# Patient Record
Sex: Male | Born: 1965 | Race: White | Hispanic: No | Marital: Married | Smoking: Current every day smoker
Health system: Southern US, Community
[De-identification: ages and names within clinical notes are randomized; demographics above are authoritative.]

## PROBLEM LIST (undated history)

## (undated) ENCOUNTER — Ambulatory Visit: Admission: EM

## (undated) ENCOUNTER — Ambulatory Visit: Admission: RE | Source: Home / Self Care

## (undated) ENCOUNTER — Ambulatory Visit: Admission: EM | Source: Home / Self Care

---

## 2011-02-18 DIAGNOSIS — N529 Male erectile dysfunction, unspecified: Secondary | ICD-10-CM | POA: Insufficient documentation

## 2011-02-18 DIAGNOSIS — F1010 Alcohol abuse, uncomplicated: Secondary | ICD-10-CM | POA: Insufficient documentation

## 2011-02-18 DIAGNOSIS — K219 Gastro-esophageal reflux disease without esophagitis: Secondary | ICD-10-CM | POA: Insufficient documentation

## 2011-02-18 DIAGNOSIS — L821 Other seborrheic keratosis: Secondary | ICD-10-CM | POA: Insufficient documentation

## 2011-02-18 DIAGNOSIS — M542 Cervicalgia: Secondary | ICD-10-CM | POA: Insufficient documentation

## 2012-02-08 DIAGNOSIS — I10 Essential (primary) hypertension: Secondary | ICD-10-CM | POA: Insufficient documentation

## 2012-02-08 DIAGNOSIS — E785 Hyperlipidemia, unspecified: Secondary | ICD-10-CM | POA: Insufficient documentation

## 2012-02-08 DIAGNOSIS — L918 Other hypertrophic disorders of the skin: Secondary | ICD-10-CM | POA: Insufficient documentation

## 2014-05-01 DIAGNOSIS — L409 Psoriasis, unspecified: Secondary | ICD-10-CM | POA: Insufficient documentation

## 2016-06-17 DIAGNOSIS — Z1211 Encounter for screening for malignant neoplasm of colon: Secondary | ICD-10-CM | POA: Insufficient documentation

## 2017-08-07 ENCOUNTER — Ambulatory Visit (INDEPENDENT_AMBULATORY_CARE_PROVIDER_SITE_OTHER): Payer: 59

## 2017-08-07 ENCOUNTER — Ambulatory Visit
Admission: EM | Admit: 2017-08-07 | Discharge: 2017-08-07 | Disposition: A | Discharge status: Home / Self Care | Payer: 59 | Attending: Family Medicine | Admitting: Family Medicine

## 2017-08-07 DIAGNOSIS — S61211A Laceration without foreign body of left index finger without damage to nail, initial encounter: Secondary | ICD-10-CM

## 2017-08-07 DIAGNOSIS — Z23 Encounter for immunization: Secondary | ICD-10-CM | POA: Diagnosis not present

## 2017-08-07 DIAGNOSIS — W298XXA Contact with other powered powered hand tools and household machinery, initial encounter: Secondary | ICD-10-CM

## 2017-08-07 MED ORDER — MUPIROCIN 2 % EX OINT: TOPICAL_OINTMENT | CUTANEOUS | 0 refills | Status: DC

## 2017-08-07 MED ORDER — CEPHALEXIN 500 MG PO CAPS: 500.0000 mg | ORAL_CAPSULE | Freq: Four times a day (QID) | ORAL | 0 refills | Status: AC

## 2017-08-07 MED ORDER — OXYCODONE-ACETAMINOPHEN 5-325 MG PO TABS: 1.0000 | ORAL_TABLET | Freq: Four times a day (QID) | ORAL | 0 refills | Status: AC | PRN

## 2017-08-07 MED ORDER — TETANUS-DIPHTH-ACELL PERTUSSIS 5-2.5-18.5 LF-MCG/0.5 IM SUSP
0.5000 mL | Freq: Once | INTRAMUSCULAR | Status: AC
  Administered 2017-08-07: 0.5 mL via INTRAMUSCULAR

## 2017-08-07 NOTE — ED Provider Notes (Addendum)
[facility] ____________________________________________  Time seen: Approximately 9:12 AM  I have reviewed the triage vital signs and the nursing notes.   HISTORY  Chief Complaint Extremity Laceration  HPI Tyler Pollard is a 52 y.o. male presenting for evaluation of laceration to left index finger that occurred last night around 8 PM.  Patient states that he was at home using a drill, and that the drill bit because the laceration to her left index finger.  States he did clean it last night.  States no other alleviating measures attempted.  States pain to the area as moderate and has continued.  Also reports some loss of sensation to the palmar aspect of the distal index finger and has pain with range of motion.  Denies fevers, purulent drainage, concern of foreign body.  Tetanus immunization greater than 5 years ago.  Denies other aggravating or alleviating factors.  Reports otherwise feels well. Denies recent sickness. Denies recent antibiotic use.    History reviewed. No pertinent past medical history.  Patient Active Problem List   Diagnosis Date Noted  . Colon cancer screening 06/17/2016  . Psoriasis 05/01/2014  . Essential hypertension 02/08/2012  . Hyperlipidemia 02/08/2012  . Skin tag 02/08/2012  . Gastroesophageal reflux disease 02/18/2011  . Male erectile dysfunction 02/18/2011  . Neck pain 02/18/2011  . Nondependent alcohol abuse 02/18/2011  . Seborrheic keratosis 02/18/2011    History reviewed. No pertinent surgical history.   No current facility-administered medications for this encounter.   Current Outpatient Medications:  Marland Kitchen  Multiple Vitamin (MULTI-VITAMINS) TABS, Take by mouth., Disp: , Rfl:  .  omeprazole (PRILOSEC) 40 MG capsule, , Disp: , Rfl:  .  tadalafil (ADCIRCA/CIALIS) 20 MG tablet, TAKE 1 TABLET DAILY AS NEEDED FOR ERECTILE DYSFUNCTION, Disp: , Rfl:  .  triamcinolone cream (KENALOG) 0.1 %, , Disp: , Rfl:  .  cephALEXin (KEFLEX) 500 MG  capsule, Take 1 capsule (500 mg total) by mouth 4 (four) times daily for 7 days., Disp: 28 capsule, Rfl: 0 .  mupirocin ointment (BACTROBAN) 2 %, Apply two times a day for 10 days., Disp: 22 g, Rfl: 0 .  oxyCODONE-acetaminophen (PERCOCET/ROXICET) 5-325 MG tablet, Take 1 tablet by mouth every 6 (six) hours as needed for up to 2 days for moderate pain or severe pain. Do not drive,can cause drowsiness., Disp: 6 tablet, Rfl: 0  Allergies Patient has no known allergies.  No family history on file.  Social History Social History   Tobacco Use  . Smoking status: Current Every Day Smoker    Packs/day: 0.50    Years: 20.00    Pack years: 10.00    Types: Cigarettes  . Smokeless tobacco: Never Used  Substance Use Topics  . Alcohol use: Yes  . Drug use: Not on file    Review of Systems Constitutional: No fever/chills Cardiovascular: Denies chest pain. Respiratory: Denies shortness of breath. Gastrointestinal: No abdominal pain.  Musculoskeletal: Negative for back pain. Skin: As above.   ____________________________________________   PHYSICAL EXAM:  VITAL SIGNS: ED Triage Vitals  Enc Vitals Group     BP 08/07/17 0816 (!) 148/93     Pulse Rate 08/07/17 0816 77     Resp 08/07/17 0816 18     Temp 08/07/17 0816 98.7 F (37.1 C)     Temp Source 08/07/17 0816 Oral     SpO2 08/07/17 0816 97 %     Weight 08/07/17 0818 185 lb (83.9 kg)     Height --  Head Circumference --      Peak Flow --      Pain Score 08/07/17 0818 8     Pain Loc --      Pain Edu? --      Excl. in GC? --     Constitutional: Alert and oriented. Well appearing and in no acute distress. ENT      Head: Normocephalic and atraumatic. Cardiovascular: Normal rate, regular rhythm. Grossly normal heart sounds.  Good peripheral circulation. Respiratory: Normal respiratory effort without tachypnea nor retractions. Breath sounds are clear and equal bilaterally. No wheezes, rales, rhonchi. Musculoskeletal: Steady  gait Neurologic:  Normal speech and language. Speech is normal. No gait instability.  Skin:  Skin is warm, dry except:    Laceration to left palmar index finger as above along the PIP joint to middle phalanx, laceration approximately 3.5 cm jagged and irregular borders, with mild to moderate tenderness to direct palpation.  Immediately distal to laceration and middle phalanx and mid distal phalanx decreased sensation but no loss of sensation, no other sensation changes noted, no point bony tenderness, no foreign body noted, no pain or decreased index extension, pain with index flexion but good DIP joint flexion resistance, slightly diminished PIP flexion no full deficit noted. No visible tendon injury. Left hand otherwise nontender.      Post repair as above.  Psychiatric: Mood and affect are normal. Speech and behavior are normal. Patient exhibits appropriate insight and judgment   ___________________________________________   LABS (all labs ordered are listed, but only abnormal results are displayed)  Labs Reviewed - No data to display  RADIOLOGY  Dg Finger Index Left  Result Date: 08/07/2017 CLINICAL DATA:  Second digit laceration following drill injury, initial encounter EXAM: LEFT INDEX FINGER 2+V COMPARISON:  None. FINDINGS: Mild soft tissue irregularity is noted consistent with the patient's given clinical history. No radiopaque foreign body is noted. No acute bony abnormality is seen. IMPRESSION: Soft tissue defect without bony abnormality. Electronically Signed   By: Alcide CleverMark  Lukens M.D.   On: 08/07/2017 08:41   ____________________________________________   PROCEDURES Procedures  Procedure(s) performed:  Procedure explained and verbal consent obtained. Consent: Verbal consent obtained. Written consent not obtained. Risks and benefits: risks, benefits and alternatives were discussed Patient identity confirmed: verbally with patient and hospital-assigned identification  number  Consent given by: patient   Laceration Repair Location: left index finger Length: 3.5 cm Foreign bodies: no foreign bodies Tendon involvement: none Nerve involvement: none Preparation: Patient was prepped and draped in the usual sterile fashion. Anesthesia with 0.25% bupivacaine digital block as well as local 1% Lidocaine 4 mls Cleaned with Betadine Irrigation solution: saline Irrigation method: jet lavage Amount of cleaning: copious Wound margins revised Repaired with 5-0 nylon Number of sutures: 4 Technique: simple interrupted  Approximation: loose Patient tolerate well. Wound well approximated post repair.  Antibiotic ointment and dressing applied.  Wound care instructions provided.  Observe for any signs of infection or other problems.     INITIAL IMPRESSION / ASSESSMENT AND PLAN / ED COURSE  Pertinent labs & imaging results that were available during my care of the patient were reviewed by me and considered in my medical decision making (see chart for details).  Well-appearing patient.  No acute distress.  Axonal injury last night at home around 8 PM.  Wound with jagged irregular margins, discussed clearly with patient concern for infection.  Patient does have pain with flexion as well as slightly limited PIP joint flexion, unsure if  due to localized swelling or possible tendon injury, no tendon deficit visible within lac.  Like was copiously cleaned, irrigated and repaired as above.  Will start patient on oral Keflex, topical Bactroban and quantity  6 Percocet tablets as needed for pain control.  Recommend close follow-up this week and recommend follow-up with Dr. Stephenie Acres through [facility].  Finger splint applied.  Discussed keeping clean.  Suture removal in 10 days.Discussed indication, risks and benefits of medications with patient.  Tetanus immunization updated.  [place] controlled substance database reviewed and no recent controlled substances  documented.  Discussed follow up with Primary care physician this week. Discussed follow up and return parameters including no resolution or any worsening concerns. Patient verbalized understanding and agreed to plan.   ____________________________________________   FINAL CLINICAL IMPRESSION(S) / ED DIAGNOSES  Final diagnoses:  Laceration of left index finger without foreign body, nail damage status unspecified, initial encounter     ED Discharge Orders        Ordered    oxyCODONE-acetaminophen (PERCOCET/ROXICET) 5-325 MG tablet  Every 6 hours PRN     08/07/17 1001    cephALEXin (KEFLEX) 500 MG capsule  4 times daily     08/07/17 1001    mupirocin ointment (BACTROBAN) 2 %     08/07/17 1001       Note: This dictation was prepared with Dragon dictation along with smaller phrase technology. Any transcriptional errors that result from this process are unintentional.         Renford Dills, NP 08/07/17 1529    Renford Dills, NP 08/07/17 [phone]

## 2017-08-07 NOTE — Discharge Instructions (Addendum)
Take medication as prescribed. Rest. Drink plenty of fluids. Keep clean. Keep in splint.  Sutures removed in 10 days as discussed.  Follow-up with orthopedic hand this week as discussed.  Call tomorrow.  Follow up with your primary care physician this week as needed. Return to Urgent care for new or worsening concerns.

## 2017-08-07 NOTE — ED Triage Notes (Signed)
Pt cut his left index finger on a drill bit yesterday and did get it to stop bleeding but no feeling on both sides of his nail under his finger tip.

## 2017-08-18 ENCOUNTER — Encounter: Payer: Self-pay | Admitting: Emergency Medicine

## 2017-08-18 ENCOUNTER — Other Ambulatory Visit: Payer: Self-pay

## 2017-08-18 ENCOUNTER — Ambulatory Visit
Admission: EM | Admit: 2017-08-18 | Discharge: 2017-08-18 | Disposition: A | Discharge status: Home / Self Care | Payer: 59

## 2017-08-18 NOTE — ED Triage Notes (Signed)
Patient here for suture removal to left index finger. Patient had 4 sutures placed.

## 2018-11-27 ENCOUNTER — Other Ambulatory Visit: Payer: Self-pay

## 2018-11-27 DIAGNOSIS — Z20822 Contact with and (suspected) exposure to covid-19: Secondary | ICD-10-CM

## 2018-11-28 LAB — NOVEL CORONAVIRUS, NAA: SARS-CoV-2, NAA: NOT DETECTED

## 2018-11-28 LAB — INPATIENT

## 2019-07-12 ENCOUNTER — Other Ambulatory Visit: Payer: Self-pay | Admitting: Family Medicine

## 2019-07-17 ENCOUNTER — Other Ambulatory Visit
Admission: RE | Admit: 2019-07-17 | Discharge: 2019-07-17 | Disposition: A | Discharge status: Home / Self Care | Payer: 59 | Attending: Urology | Admitting: Urology

## 2019-07-17 ENCOUNTER — Encounter: Payer: Self-pay | Admitting: Urology

## 2019-07-17 ENCOUNTER — Ambulatory Visit: Payer: 59 | Admitting: Urology

## 2019-07-17 ENCOUNTER — Other Ambulatory Visit: Payer: Self-pay

## 2019-07-17 VITALS — BP 119/88 | HR 102 | Ht 73.0 in | Wt 199.0 lb

## 2019-07-17 DIAGNOSIS — R972 Elevated prostate specific antigen [PSA]: Secondary | ICD-10-CM | POA: Insufficient documentation

## 2019-07-17 MED ORDER — TAMSULOSIN HCL 0.4 MG PO CAPS: 0.4000 mg | ORAL_CAPSULE | Freq: Every day | ORAL | 11 refills | Status: DC

## 2019-07-17 NOTE — Patient Instructions (Signed)
Prostate Cancer Screening  Prostate cancer screening is a test that is done to check for the presence of prostate cancer in men. The prostate gland is a walnut-sized gland that is located below the bladder and in front of the rectum in males. The function of the prostate is to add fluid to semen during ejaculation. Prostate cancer is the second most common type of cancer in men. Who should have prostate cancer screening?  Screening recommendations vary based on age and other risk factors. Screening is recommended if:  You are older than age 55. If you are age 55-69, talk with your health care provider about your need for screening and how often screening should be done. Because most prostate cancers are slow growing and will not cause death, screening is generally reserved in this age group for men who have a 10-15-year life expectancy.  You are younger than age 55, and you have these risk factors: ? Being a black male or a male of African descent. ? Having a father, brother, or uncle who has been diagnosed with prostate cancer. The risk is higher if your family member's cancer occurred at an early age. Screening is not recommended if:  You are younger than age 40.  You are between the ages of 40 and 54 and you have no risk factors.  You are 70 years of age or older. At this age, the risks that screening can cause are greater than the benefits that it may provide. If you are at high risk for prostate cancer, your health care provider may recommend that you have screenings more often or that you start screening at a younger age. How is screening for prostate cancer done? The recommended prostate cancer screening test is a blood test called the prostate-specific antigen (PSA) test. PSA is a protein that is made in the prostate. As you age, your prostate naturally produces more PSA. Abnormally high PSA levels may be caused by:  Prostate cancer.  An enlarged prostate that is not caused by cancer  (benign prostatic hyperplasia, BPH). This condition is very common in older men.  A prostate gland infection (prostatitis). Depending on the PSA results, you may need more tests, such as:  A physical exam to check the size of your prostate gland.  Blood and imaging tests.  A procedure to remove tissue samples from your prostate gland for testing (biopsy). What are the benefits of prostate cancer screening?  Screening can help to identify cancer at an early stage, before symptoms start and when the cancer can be treated more easily.  There is a small chance that screening may lower your risk of dying from prostate cancer. The chance is small because prostate cancer is a slow-growing cancer, and most men with prostate cancer die from a different cause. What are the risks of prostate cancer screening? The main risk of prostate cancer screening is diagnosing and treating prostate cancer that would never have caused any symptoms or problems. This is called overdiagnosisand overtreatment. PSA screening cannot tell you if your PSA is high due to cancer or a different cause. A prostate biopsy is the only procedure to diagnose prostate cancer. Even the results of a biopsy may not tell you if your cancer needs to be treated. Slow-growing prostate cancer may not need any treatment other than monitoring, so diagnosing and treating it may cause unnecessary stress or other side effects. A prostate biopsy may also cause:  Infection or fever.  A false negative. This is   a result that shows that you do not have prostate cancer when you actually do have prostate cancer. Questions to ask your health care provider  When should I start prostate cancer screening?  What is my risk for prostate cancer?  How often do I need screening?  What type of screening tests do I need?  How do I get my test results?  What do my results mean?  Do I need treatment? Where to find more information  The American Cancer  Society: www.cancer.org  American Urological Association: www.auanet.org Contact a health care provider if:  You have difficulty urinating.  You have pain when you urinate or ejaculate.  You have blood in your urine or semen.  You have pain in your back or in the area of your prostate. Summary  Prostate cancer is a common type of cancer in men. The prostate gland is located below the bladder and in front of the rectum. This gland adds fluid to semen during ejaculation.  Prostate cancer screening may identify cancer at an early stage, when the cancer can be treated more easily.  The prostate-specific antigen (PSA) test is the recommended screening test for prostate cancer.  Discuss the risks and benefits of prostate cancer screening with your health care provider. If you are age 70 or older, the risks that screening can cause are greater than the benefits that it may provide. This information is not intended to replace advice given to you by your health care provider. Make sure you discuss any questions you have with your health care provider. Document Revised: 08/10/2018 Document Reviewed: 08/10/2018 Elsevier Patient Education  2020 Elsevier Inc.   Transrectal Ultrasound-Guided Prostate Biopsy A transrectal ultrasound-guided prostate biopsy is a procedure to remove samples of prostate tissue for testing. The procedure uses ultrasound images to guide the process of removing the samples. The samples are taken to a lab to be checked for prostate cancer. This procedure is usually done to evaluate the prostate gland of men who have raised (elevated) levels of prostate-specific antigen (PSA), which can be a sign of prostate cancer. Tell a health care provider about:  Any allergies you have.  All medicines you are taking, including vitamins, herbs, eye drops, creams, and over-the-counter medicines.  Any problems you or family members have had with anesthetic medicines.  Any blood  disorders you have.  Any surgeries you have had.  Any medical conditions you have. What are the risks? Generally, this is a safe procedure. However, problems may occur, including:  Prostate infection.  Bleeding from the rectum.  Blood in the urine.  Allergic reactions to medicines.  Damage to surrounding structures such as blood vessels, organs, or muscles.  Difficulty passing urine.  Nerve damage. This is usually temporary.  Pain. What happens before the procedure? Staying hydrated Follow instructions from your health care provider about hydration, which may include:  Up to 2 hours before the procedure - you may continue to drink clear liquids, such as water, clear fruit juice, black coffee, and plain tea.  Eating and drinking restrictions Follow instructions from your health care provider about eating and drinking, which may include:  8 hours before the procedure - stop eating heavy meals or foods such as meat, fried foods, or fatty foods.  6 hours before the procedure - stop eating light meals or foods, such as toast or cereal.  6 hours before the procedure - stop drinking milk or drinks that contain milk.  2 hours before the procedure -   stop drinking clear liquids. Medicines Ask your health care provider about:  Changing or stopping your regular medicines. This is especially important if you are taking diabetes medicines or blood thinners.  Taking over-the-counter medicines, vitamins, herbs, and supplements.  Taking medicines such as aspirin and ibuprofen. These medicines can thin your blood. Do not take these medicines unless your health care provider tells you to take them. General instructions  You may be given antibiotic medicine to help prevent infection. If so, take the antibiotic as told by your health care provider.  You will be given an enema. During an enema, a liquid is injected into your rectum to clear out waste.  You may have a blood or urine  sample taken.  Plan to have someone take you home from the hospital or clinic. What happens during the procedure?   To lower your risk of infection: ? Your health care team will wash or sanitize their hands. ? Hair may be removed from the surgical area. ? Your skin will be cleaned with a germ-killing soap. ? You may be given antibiotic medicine.  An IV will be inserted into one of your veins.  You will be given one or both of the following: ? A medicine to help you relax (sedative). ? A medicine to numb the area (local anesthetic).  You will be placed on your left side, and your knees will be bent toward your chest.  A probe with lubricated gel will be placed into your rectum, and images will be taken of your prostate and surrounding structures.  Numbing medicine will be injected into your prostate.  A biopsy needle will be inserted through your rectum and guided to your prostate using the ultrasound images.  Prostate tissue samples will be removed, and the needle will then be removed.  The biopsy samples will be sent to a lab to be tested. The procedure may vary among health care providers and hospitals. What happens after the procedure?  Your blood pressure, heart rate, breathing rate, and blood oxygen level will be monitored until the medicines you were given have worn off.  You may have some discomfort in the rectal area. You will be given pain medicine as needed.  Do not drive for 24 hours if you received a sedative. Summary  A transrectal ultrasound-guided biopsy removes samples of tissue from your prostate.  This procedure is usually done to evaluate the prostate gland of men who have raised (elevated) levels of prostate-specific antigen (PSA), which can be a sign of prostate cancer.  After your procedure, you may feel some discomfort in the rectal area.  Plan to have someone take you home from the hospital or clinic. This information is not intended to replace  advice given to you by your health care provider. Make sure you discuss any questions you have with your health care provider. Document Revised: 04/19/2018 Document Reviewed: 03/26/2016 Elsevier Patient Education  2020 Elsevier Inc.  

## 2019-07-17 NOTE — Progress Notes (Signed)
   07/17/19 2:19 PM   Tyler Pollard [birth year] [id number]  CC: Elevated PSA  HPI: I saw Tyler Pollard in urology clinic today for elevated PSA.  He was found to have an elevated PSA of 11.4 on 07/05/2019 on routine screening with his PCP.  Only other prior PSA value was normal at 3.55 in April 2018.  He denies any family history of prostate cancer.  He has a family history of nonlethal breast cancer in his grandmother.  He notes worsening urinary symptoms over the last 6 months with weakening stream and feeling of incomplete emptying.  He denies any gross hematuria or prior urinary infections.  No prior prostate biopsies.  He has moderate erectile dysfunction at baseline and takes Cialis.  This seems to be decreasing in effectiveness lately, and his PCP recently changed him to Viagra but he has not tried this yet.    Social History:  reports that he has been smoking cigarettes. He has a 10.00 pack-year smoking history. He has never used smokeless tobacco. He reports current alcohol use. No history on file for drug use.  Physical Exam: BP 119/88   Pulse (!) 102   Ht 6\' 1"  (1.854 m)   Wt 199 lb (90.3 kg)   BMI 26.25 kg/m    Constitutional:  Alert and oriented, No acute distress. Cardiovascular: No clubbing, cyanosis, or edema. Respiratory: Normal respiratory effort, no increased work of breathing. GI: Abdomen is soft, nontender, nondistended, no abdominal masses DRE: 40g, smooth no nodules or masses  Laboratory Data: See HPI for PSA history  Pertinent Imaging: None to review  Assessment & Plan:   54 year old male with elevated PSA of 11.4 from prior value of 3.5, no family history of prostate cancer, normal DRE.  We reviewed the implications of an elevated PSA and the uncertainty surrounding it. In general, a man's PSA increases with age and is produced by both normal and cancerous prostate tissue. The differential diagnosis for elevated PSA includes BPH, prostate cancer, infection, recent  intercourse/ejaculation, recent urethroscopic manipulation (foley placement/cystoscopy) or trauma, and prostatitis.   Management of an elevated PSA can include observation or prostate biopsy and we discussed this in detail. Our goal is to detect clinically significant prostate cancers, and manage with either active surveillance, surgery, or radiation for localized disease. Risks of prostate biopsy include bleeding, infection (including life threatening sepsis), pain, and lower urinary symptoms. Hematuria, hematospermia, and blood in the stool are all common after biopsy and can persist up to 4 weeks.   PSA today with reflex to free Call with results, if PSA remains elevated pursue prostate biopsy  57, MD 07/17/2019  [facility] [address] [place], [place] [phone]

## 2019-07-18 ENCOUNTER — Other Ambulatory Visit: Payer: Self-pay | Admitting: Urology

## 2019-07-18 LAB — PSA, TOTAL AND FREE
PSA, Free Pct: 9.4 %
PSA, Free: 1.17 ng/mL
Prostate Specific Ag, Serum: 12.5 ng/mL — ABNORMAL HIGH (ref 0.0–4.0)

## 2019-07-18 MED ORDER — DIAZEPAM 5 MG PO TABS: 5.0000 mg | ORAL_TABLET | Freq: Once | ORAL | 0 refills | Status: DC | PRN

## 2019-07-18 NOTE — Progress Notes (Signed)
valim

## 2019-07-19 ENCOUNTER — Telehealth: Payer: Self-pay

## 2019-07-19 NOTE — Telephone Encounter (Signed)
Called pt informed him of the information below. Pt gave verbal understanding. Appt scheduled. No cardiac clearance needed. Pt instructions given and mailed.

## 2019-07-19 NOTE — Telephone Encounter (Signed)
-----   Message from Sondra Come, MD sent at 07/18/2019 10:26 AM EDT ----- PSA remains elevated at 12.5 from 11.4, needs a prostate biopsy as discussed in clinic. Biopsy should be scheduled within the next 2-3 weeks  Please review biopsy instructions, I'll send a valium to his pharmacy, thanks  Legrand Rams, MD 07/18/2019

## 2019-08-02 ENCOUNTER — Other Ambulatory Visit: Payer: 59 | Admitting: Urology

## 2019-08-09 ENCOUNTER — Other Ambulatory Visit: Payer: 59 | Admitting: Urology

## 2019-08-13 ENCOUNTER — Telehealth: Payer: Self-pay

## 2019-08-13 NOTE — Telephone Encounter (Signed)
Called pt he is inquiring about how many days he needs to miss work post prostate biopsy. 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 Advised pt that he will need to take the day off of the procedure but can return to work the next day but must avoid heavy lifting and strenuous activity, note can be provided. Pt gave verbal understanding.

## 2019-08-16 ENCOUNTER — Encounter: Payer: Self-pay | Admitting: Urology

## 2019-08-16 ENCOUNTER — Ambulatory Visit (INDEPENDENT_AMBULATORY_CARE_PROVIDER_SITE_OTHER): Payer: 59 | Admitting: Urology

## 2019-08-16 ENCOUNTER — Other Ambulatory Visit: Payer: Self-pay | Admitting: Urology

## 2019-08-16 ENCOUNTER — Other Ambulatory Visit: Payer: Self-pay

## 2019-08-16 VITALS — BP 120/83 | HR 103 | Ht 73.0 in | Wt 199.0 lb

## 2019-08-16 DIAGNOSIS — R972 Elevated prostate specific antigen [PSA]: Secondary | ICD-10-CM | POA: Diagnosis not present

## 2019-08-16 MED ORDER — GENTAMICIN SULFATE 40 MG/ML IJ SOLN
80.0000 mg | Freq: Once | INTRAMUSCULAR | Status: AC
  Administered 2019-08-16: 80 mg via INTRAMUSCULAR

## 2019-08-16 MED ORDER — LEVOFLOXACIN 500 MG PO TABS
500.0000 mg | ORAL_TABLET | Freq: Once | ORAL | Status: AC
  Administered 2019-08-16: 500 mg via ORAL

## 2019-08-16 NOTE — Progress Notes (Signed)
   08/16/19  Indication: Elevated PSA, 12.5  Prostate Biopsy Procedure   Informed consent was obtained, and we discussed the risks of bleeding and infection/sepsis. A time out was performed to ensure correct patient identity.  Pre-Procedure: - Last PSA Level: 12.5 - Gentamicin and levaquin given for antibiotic prophylaxis - Transrectal Ultrasound performed revealing a 70 gm prostate, PSA density 0.18 - No significant hypoechoic or median lobe noted  Procedure: - Prostate block performed using 10 cc 1% lidocaine and biopsies taken from sextant areas, a total of 12 under ultrasound guidance.  Post-Procedure: - Patient tolerated the procedure well - He was counseled to seek immediate medical attention if experiences significant bleeding, fevers, or severe pain - Return in one week to discuss biopsy results  Assessment/ Plan: Will follow up in 1-2 weeks to discuss pathology  Legrand Rams, MD 08/16/2019

## 2019-08-16 NOTE — Addendum Note (Signed)
Addended by: Frankey Shown on: 08/16/2019 11:55 AM   Modules accepted: Orders

## 2019-08-16 NOTE — Patient Instructions (Signed)

## 2019-08-20 ENCOUNTER — Telehealth: Payer: Self-pay

## 2019-08-20 LAB — SURGICAL PATHOLOGY

## 2019-08-20 NOTE — Telephone Encounter (Signed)
Patient called stating that since his biopsy last week he has had terrible lower back pain and wanted to know if this could be from the procedure. Patient denies any other symptoms, states no fever, chills, blood, or discomfort with urination or bowel movements. It was explained that back pain would not be something expected after the biopsy but perhaps the postion during the procedure may have caused discomfort. Patient was encouraged to follow up with PCP for further evaluation. Patient verbalized understanding

## 2019-08-21 ENCOUNTER — Other Ambulatory Visit: Payer: Self-pay

## 2019-08-21 ENCOUNTER — Ambulatory Visit: Payer: 59 | Admitting: Urology

## 2019-08-21 ENCOUNTER — Encounter: Payer: Self-pay | Admitting: Urology

## 2019-08-21 VITALS — BP 137/89 | HR 79 | Ht 73.0 in | Wt 194.0 lb

## 2019-08-21 DIAGNOSIS — N529 Male erectile dysfunction, unspecified: Secondary | ICD-10-CM

## 2019-08-21 DIAGNOSIS — R972 Elevated prostate specific antigen [PSA]: Secondary | ICD-10-CM

## 2019-08-21 DIAGNOSIS — N138 Other obstructive and reflux uropathy: Secondary | ICD-10-CM

## 2019-08-21 DIAGNOSIS — N401 Enlarged prostate with lower urinary tract symptoms: Secondary | ICD-10-CM | POA: Diagnosis not present

## 2019-08-21 LAB — PATHOLOGY

## 2019-08-21 NOTE — Progress Notes (Signed)
° °  08/21/2019 11:27 AM   Tyler Pollard 05-07-1965 [id number]  Reason for visit: Follow up prostate biopsy results, BPH, ED  HPI: I saw Tyler Pollard back in urology clinic to review his prostate biopsy results.  He is a 54 year old male who had an elevated PSA of 11.4, and on repeat remained elevated at 12.5.  He also has BPH at baseline on Flomax, as well as ED with reduced responsiveness to Cialis and sildenafil max dosing.  He underwent a prostate biopsy on 08/16/2019 that showed a 70 g prostate with a PSA density of 0.18.  All biopsy cores were negative with no evidence of malignancy, ASAP, or HGPIN.  We reviewed these results at length today.  We discussed the approximately 15% false negative rate of a prostate biopsy.  We discussed need for close monitoring of the PSA moving forward with a repeat PSA with reflex to free in 3 months, and consideration of prostate MRI in the future if PSA continues to rise.  Regarding his BPH, his symptoms are currently well controlled on Flomax, but we reviewed the possibility of an outlet procedure in the future with his enlarged 70 g prostate.  Information was provided about HoLEP today.  We also discussed the possible role of follow-up for both outlet procedure and tissue diagnosis if he had a persistently rising PSA with negative prostate biopsy and a negative MRI in the future.  Finally, regarding his erectile dysfunction, we discussed the role of PDE 5 inhibitors and the risks and benefits, as well as the max dosing.  We discussed other options including vacuum erection device or penile injections.  He is unsure if he would like to pursue injections in the future, and will think about it over the next few months.  Continue Flomax for BPH Continue Viagra 100 mg on demand, or Cialis 20 mg on demand for ED RTC 3 months with PSA prior  Sondra Come, MD  [facility] [address] [place], [place] [address] [phone]

## 2019-08-21 NOTE — Patient Instructions (Addendum)
Prostate Cancer Screening  Prostate cancer screening is a test that is done to check for the presence of prostate cancer in men. The prostate gland is a walnut-sized gland that is located below the bladder and in front of the rectum in males. The function of the prostate is to add fluid to semen during ejaculation. Prostate cancer is the second most common type of cancer in men. Who should have prostate cancer screening?  Screening recommendations vary based on age and other risk factors. Screening is recommended if:  You are older than age 21. If you are age 43-69, talk with your health care provider about your need for screening and how often screening should be done. Because most prostate cancers are slow growing and will not cause death, screening is generally reserved in this age group for men who have a 10-15-year life expectancy.  You are younger than age 28, and you have these risk factors: ? Being a black male or a male of African descent. ? Having a father, brother, or uncle who has been diagnosed with prostate cancer. The risk is higher if your family member's cancer occurred at an early age. Screening is not recommended if:  You are younger than age 46.  You are between the ages of 69 and 63 and you have no risk factors.  You are 57 years of age or older. At this age, the risks that screening can cause are greater than the benefits that it may provide. If you are at high risk for prostate cancer, your health care provider may recommend that you have screenings more often or that you start screening at a younger age. How is screening for prostate cancer done? The recommended prostate cancer screening test is a blood test called the prostate-specific antigen (PSA) test. PSA is a protein that is made in the prostate. As you age, your prostate naturally produces more PSA. Abnormally high PSA levels may be caused by:  Prostate cancer.  An enlarged prostate that is not caused by cancer  (benign prostatic hyperplasia, BPH). This condition is very common in older men.  A prostate gland infection (prostatitis). Depending on the PSA results, you may need more tests, such as:  A physical exam to check the size of your prostate gland.  Blood and imaging tests.  A procedure to remove tissue samples from your prostate gland for testing (biopsy). What are the benefits of prostate cancer screening?  Screening can help to identify cancer at an early stage, before symptoms start and when the cancer can be treated more easily.  There is a small chance that screening may lower your risk of dying from prostate cancer. The chance is small because prostate cancer is a slow-growing cancer, and most men with prostate cancer die from a different cause. What are the risks of prostate cancer screening? The main risk of prostate cancer screening is diagnosing and treating prostate cancer that would never have caused any symptoms or problems. This is called overdiagnosisand overtreatment. PSA screening cannot tell you if your PSA is high due to cancer or a different cause. A prostate biopsy is the only procedure to diagnose prostate cancer. Even the results of a biopsy may not tell you if your cancer needs to be treated. Slow-growing prostate cancer may not need any treatment other than monitoring, so diagnosing and treating it may cause unnecessary stress or other side effects. A prostate biopsy may also cause:  Infection or fever.  A false negative. This is  a result that shows that you do not have prostate cancer when you actually do have prostate cancer. Questions to ask your health care provider  When should I start prostate cancer screening?  What is my risk for prostate cancer?  How often do I need screening?  What type of screening tests do I need?  How do I get my test results?  What do my results mean?  Do I need treatment? Where to find more information  The American Cancer  Society: www.cancer.org  American Urological Association: www.auanet.org Contact a health care provider if:  You have difficulty urinating.  You have pain when you urinate or ejaculate.  You have blood in your urine or semen.  You have pain in your back or in the area of your prostate. Summary  Prostate cancer is a common type of cancer in men. The prostate gland is located below the bladder and in front of the rectum. This gland adds fluid to semen during ejaculation.  Prostate cancer screening may identify cancer at an early stage, when the cancer can be treated more easily.  The prostate-specific antigen (PSA) test is the recommended screening test for prostate cancer.  Discuss the risks and benefits of prostate cancer screening with your health care provider. If you are age 70 or older, the risks that screening can cause are greater than the benefits that it may provide. This information is not intended to replace advice given to you by your health care provider. Make sure you discuss any questions you have with your health care provider. Document Revised: 08/10/2018 Document Reviewed: 08/10/2018 Elsevier Patient Education  2020 Elsevier Inc.   Benign Prostatic Hyperplasia  Benign prostatic hyperplasia (BPH) is an enlarged prostate gland that is caused by the normal aging process and not by cancer. The prostate is a walnut-sized gland that is involved in the production of semen. It is located in front of the rectum and below the bladder. The bladder stores urine and the urethra is the tube that carries the urine out of the body. The prostate may get bigger as a man gets older. An enlarged prostate can press on the urethra. This can make it harder to pass urine. The build-up of urine in the bladder can cause infection. Back pressure and infection may progress to bladder damage and kidney (renal) failure. What are the causes? This condition is part of a normal aging process.  However, not all men develop problems from this condition. If the prostate enlarges away from the urethra, urine flow will not be blocked. If it enlarges toward the urethra and compresses it, there will be problems passing urine. What increases the risk? This condition is more likely to develop in men over the age of 50 years. What are the signs or symptoms? Symptoms of this condition include:  Getting up often during the night to urinate.  Needing to urinate frequently during the day.  Difficulty starting urine flow.  Decrease in size and strength of your urine stream.  Leaking (dribbling) after urinating.  Inability to pass urine. This needs immediate treatment.  Inability to completely empty your bladder.  Pain when you pass urine. This is more common if there is also an infection.  Urinary tract infection (UTI). How is this diagnosed? This condition is diagnosed based on your medical history, a physical exam, and your symptoms. Tests will also be done, such as:  A post-void bladder scan. This measures any amount of urine that may remain in your   bladder after you finish urinating.  A digital rectal exam. In a rectal exam, your health care provider checks your prostate by putting a lubricated, gloved finger into your rectum to feel the back of your prostate gland. This exam detects the size of your gland and any abnormal lumps or growths.  An exam of your urine (urinalysis).  A prostate specific antigen (PSA) screening. This is a blood test used to screen for prostate cancer.  An ultrasound. This test uses sound waves to electronically produce a picture of your prostate gland. Your health care provider may refer you to a specialist in kidney and prostate diseases (urologist). How is this treated? Once symptoms begin, your health care provider will monitor your condition (active surveillance or watchful waiting). Treatment for this condition will depend on the severity of your  condition. Treatment may include:  Observation and yearly exams. This may be the only treatment needed if your condition and symptoms are mild.  Medicines to relieve your symptoms, including: ? Medicines to shrink the prostate. ? Medicines to relax the muscle of the prostate.  Surgery in severe cases. Surgery may include: ? Prostatectomy. In this procedure, the prostate tissue is removed completely through an open incision or with a laparoscope or robotics. ? Transurethral resection of the prostate (TURP). In this procedure, a tool is inserted through the opening at the tip of the penis (urethra). It is used to cut away tissue of the inner core of the prostate. The pieces are removed through the same opening of the penis. This removes the blockage. ? Transurethral incision (TUIP). In this procedure, small cuts are made in the prostate. This lessens the prostate's pressure on the urethra. ? Transurethral microwave thermotherapy (TUMT). This procedure uses microwaves to create heat. The heat destroys and removes a small amount of prostate tissue. ? Transurethral needle ablation (TUNA). This procedure uses radio frequencies to destroy and remove a small amount of prostate tissue. ? Interstitial laser coagulation (ILC). This procedure uses a laser to destroy and remove a small amount of prostate tissue. ? Transurethral electrovaporization (TUVP). This procedure uses electrodes to destroy and remove a small amount of prostate tissue. ? Prostatic urethral lift. This procedure inserts an implant to push the lobes of the prostate away from the urethra. Follow these instructions at home:  Take over-the-counter and prescription medicines only as told by your health care provider.  Monitor your symptoms for any changes. Contact your health care provider with any changes.  Avoid drinking large amounts of liquid before going to bed or out in public.  Avoid or reduce how much caffeine or alcohol you  drink.  Give yourself time when you urinate.  Keep all follow-up visits as told by your health care provider. This is important. Contact a health care provider if:  You have unexplained back pain.  Your symptoms do not get better with treatment.  You develop side effects from the medicine you are taking.  Your urine becomes very dark or has a bad smell.  Your lower abdomen becomes distended and you have trouble passing your urine. Get help right away if:  You have a fever or chills.  You suddenly cannot urinate.  You feel lightheaded, or very dizzy, or you faint.  There are large amounts of blood or clots in the urine.  Your urinary problems become hard to manage.  You develop moderate to severe low back or flank pain. The flank is the side of your body between the ribs   and the hip. These symptoms may represent a serious problem that is an emergency. Do not wait to see if the symptoms will go away. Get medical help right away. Call your local emergency services (911 in the [place] Do not drive yourself to the hospital. Summary  Benign prostatic hyperplasia (BPH) is an enlarged prostate that is caused by the normal aging process and not by cancer.  An enlarged prostate can press on the urethra. This can make it hard to pass urine.  This condition is part of a normal aging process and is more likely to develop in men over the age of 50 years.  Get help right away if you suddenly cannot urinate. This information is not intended to replace advice given to you by your health care provider. Make sure you discuss any questions you have with your health care provider. Document Revised: 11/22/2017 Document Reviewed: 02/02/2016 Elsevier Patient Education  2020 Elsevier Inc.  Holmium Laser Enucleation of the Prostate (HoLEP)  HoLEP is a treatment for men with benign prostatic hyperplasia (BPH). The laser surgery removed blockages of urine flow, and is done without any incisions on  the body.     What is HoLEP?  HoLEP is a type of laser surgery used to treat obstruction (blockage) of urine flow as a result of benign prostatic hyperplasia (BPH). In men with BPH, the prostate gland is not cancerous, but has become enlarged. An enlarged prostate can result in a number of urinary tract symptoms such as weak urinary stream, difficulty in starting urination, inability to urinate, frequent urination, or getting up at night to urinate.  HoLEP was developed in the 1990's as a more effective and less expensive surgical option for BPH, compared to other surgical options such as laser vaporization(PVP/greenlight laser), transurethral resection of the prostate(TURP), and open simple prostatectomy.   What happens during a HoLEP?  HoLEP requires general anesthesia (asleep throughout the procedure).   An antibiotic is given to reduce the risk of infection  A surgical instrument called a resectoscope is inserted through the urethra (the tube that carries urine from the bladder). The resectoscope has a camera that allows the surgeon to view the internal structure of the prostate gland, and to see where the incisions are being made during surgery.  The laser is inserted into the resectoscope and is used to enucleate (free up) the enlarged prostate tissue from the capsule (outer shell) and then to seal up any blood vessels. The tissue that has been removed is pushed back into the bladder.  A morcellator is placed through the resectoscope, and is used to suction out the prostate tissue that has been pushed into the bladder.  When the prostate tissue has been removed, the resectoscope is removed, and a foley catheter is placed to allow healing and drain the urine from the bladder.     What happens after a HoLEP?  More than 90% of patients go home the same day a few hours after surgery. Less than 10% will be admitted to the hospital overnight for observation to monitor the urine, or if  they have other medical problems.  Fluid is flushed through the catheter for about 1 hour after surgery to clear any blood from the urine. It is normal to have some blood in the urine after surgery. The need for blood transfusion is extremely rare.  Eating and drinking are permitted after the procedure once the patient has fully awakened from anesthesia.  The catheter is usually removed 2-3  days after surgery- the patient will come to clinic to have the catheter removed and make sure they can urinate on their own.  It is very important to drink lots of fluids after surgery for one week to keep the bladder flushed.  At first, there may be some burning with urination, but this typically improved within a few hours to days. Most patients do not have a significant amount of pain, and narcotic pain medications are rarely needed.  Symptoms of urinary frequency, urgency, and even leakage are NORMAL for the first few weeks after surgery as the bladder adjusts after having to work hard against blockage from the prostate for many years. This will improve, but can sometimes take several months.  The use of pelvic floor exercises (Kegel exercises) can help improve problems with urinary incontinence.   After catheter removal, patients will be seen at 6 weeks and 6 months for symptom check  No heavy lifting for at least 2-3 weeks after surgery, however patients can walk and do light activities the first day after surgery. Return to work time depends on occupation.    What are the advantages of HoLEP?  HoLEP has been studied in many different parts of the world and has been shown to be a safe and effective procedure. Although there are many types of BPH surgeries available, HoLEP offers a unique advantage in being able to remove a large amount of tissue without any incisions on the body, even in very large prostates, while decreasing the risk of bleeding and providing tissue for pathology (to look for  cancer). This decreases the need for blood transfusions during surgery, minimizes hospital stay, and reduces the risk of needing repeat treatment.  What are the side effects of HoLEP?  Temporary burning and bleeding during urination. Some blood may be seen in the urine for weeks after surgery and is part of the healing process.  Urinary incontinence (inability to control urine flow) is expected in all patients immediately after surgery and they should wear pads for the first few days/weeks. This typically improves over the course of several weeks. Performing Kegel exercises can help decrease leakage from stress maneuvers such as coughing, sneezing, or lifting. The rate of long term leakage is very low. Patients may also have leakage with urgency and this may be treated with medication. The risk of urge incontinence can be dependent on several factors including age, prostate size, symptoms, and other medical problems.  Retrograde ejaculation or "backwards ejaculation." In 75% of cases, the patient will not see any fluid during ejaculation after surgery.  Erectile function is generally not significantly affected.   What are the risks of HoLEP?  Injury to the urethra or development of scar tissue at a later date  Injury to the capsule of the prostate (typically treated with longer catheterization).  Injury to the bladder or ureteral orifices (where the urine from the kidney drains out)  Infection of the bladder, testes, or kidneys  Return of urinary obstruction at a later date requiring another operation (<2%)  Need for blood transfusion or re-operation due to bleeding  Failure to relieve all symptoms and/or need for prolonged catheterization after surgery  5-15% of patients are found to have previously undiagnosed prostate cancer in their specimen. Prostate cancer can be treated after HoLEP.  Standard risks of anesthesia including blood clots, heart attacks, etc  When should I call my  doctor?  Fever over 101.3 degrees  Inability to urinate, or large blood clots in the   urine

## 2019-08-23 ENCOUNTER — Ambulatory Visit: Payer: 59 | Admitting: Urology

## 2019-08-27 IMAGING — CR DG FINGER INDEX 2+V*L*
3 series · 3 of 3 positions shown · non-contrast
Comparison: None.

CLINICAL DATA: Second digit laceration following drill injury,
initial encounter

EXAM:
LEFT INDEX FINGER 2+V

[finger ap]
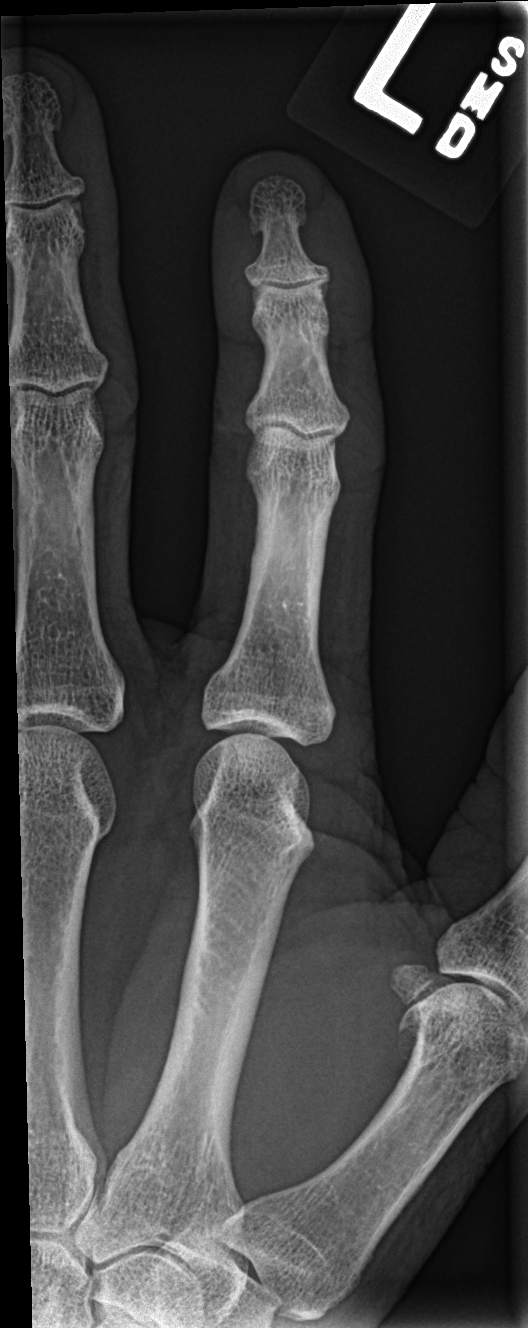

[finger obl]
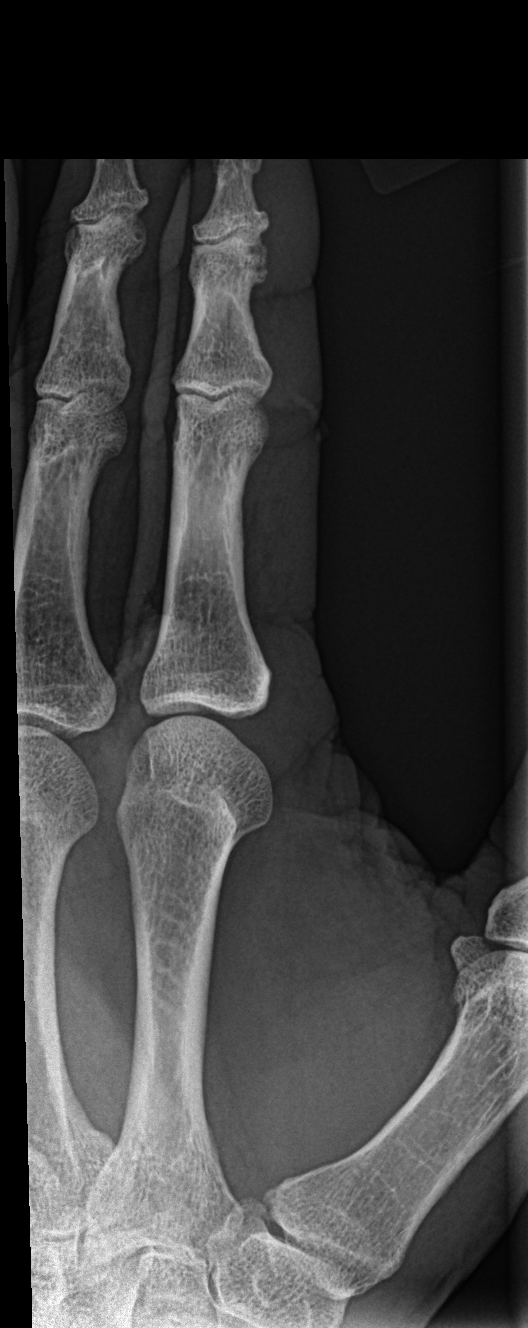

[finger lat]
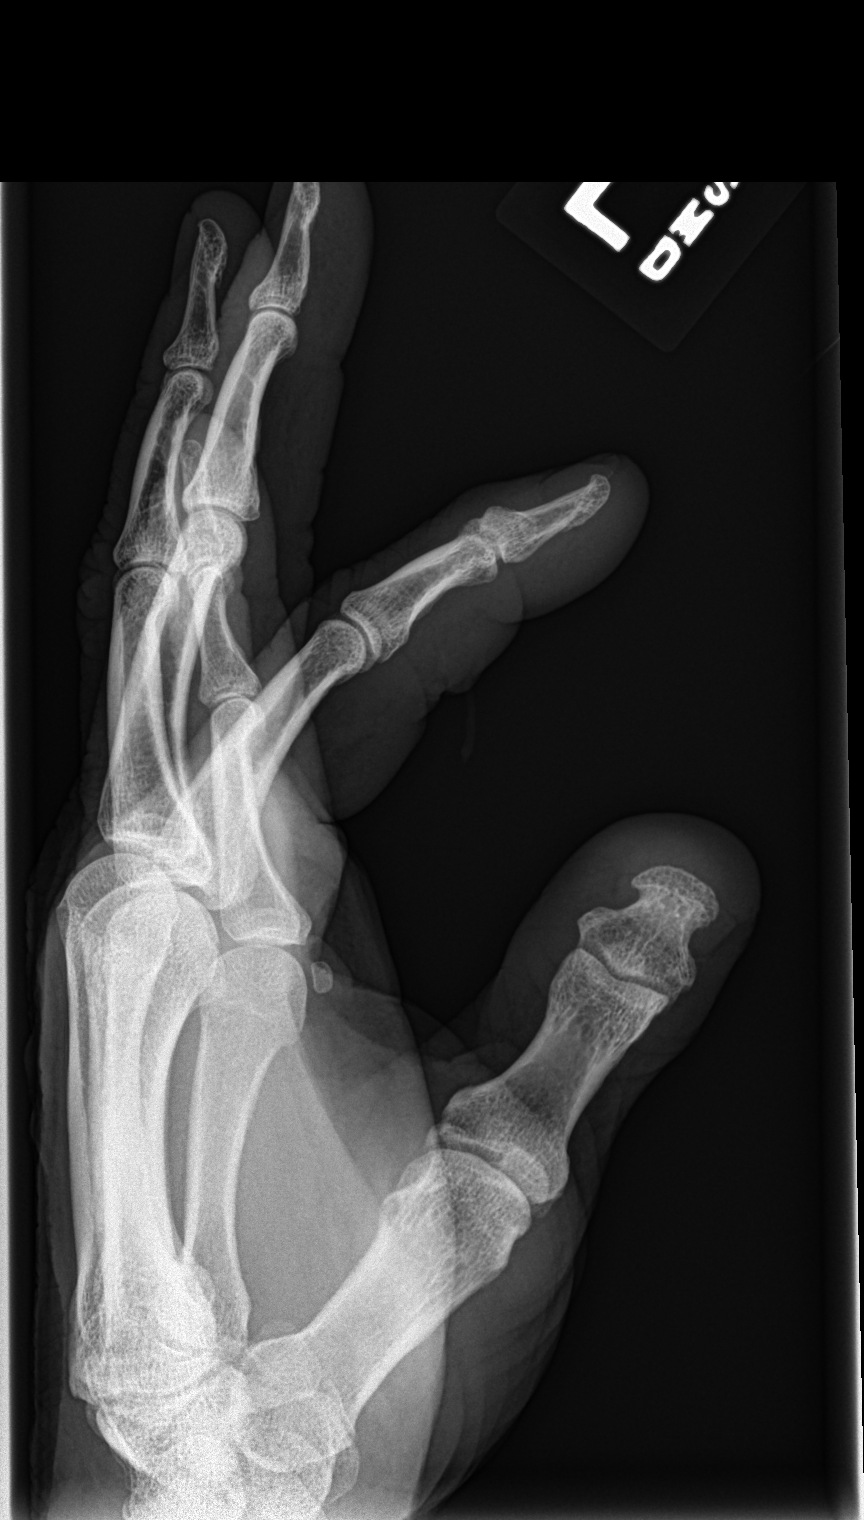

[3 of 3 positions shown; findings below may reference images not displayed]

FINDINGS: Mild soft tissue irregularity is noted consistent with the patient's
given clinical history. No radiopaque foreign body is noted. No
acute bony abnormality is seen.
IMPRESSION: Soft tissue defect without bony abnormality.

## 2019-11-19 ENCOUNTER — Other Ambulatory Visit: Payer: Self-pay

## 2019-11-19 DIAGNOSIS — R972 Elevated prostate specific antigen [PSA]: Secondary | ICD-10-CM

## 2019-11-20 ENCOUNTER — Ambulatory Visit: Payer: 59 | Admitting: Urology

## 2020-07-30 ENCOUNTER — Telehealth: Payer: 59 | Admitting: Urology

## 2020-07-30 ENCOUNTER — Other Ambulatory Visit: Payer: Self-pay | Admitting: Urology

## 2020-07-30 DIAGNOSIS — N138 Other obstructive and reflux uropathy: Secondary | ICD-10-CM

## 2020-07-30 DIAGNOSIS — N401 Enlarged prostate with lower urinary tract symptoms: Secondary | ICD-10-CM

## 2020-07-30 MED ORDER — TAMSULOSIN HCL 0.4 MG PO CAPS: 0.4000 mg | ORAL_CAPSULE | Freq: Every day | ORAL | 0 refills | Status: DC

## 2020-07-30 NOTE — Telephone Encounter (Signed)
Short term RX was filled for patient.

## 2020-07-30 NOTE — Telephone Encounter (Signed)
Patient called and asked if we could call in his Flomax for 30 days until his new insurance kicks in? He was told that he needed an appt first before we would refill it.  He said he would make an appt after he gets his insurance.   Tyler Pollard

## 2020-09-09 ENCOUNTER — Other Ambulatory Visit
Admission: RE | Admit: 2020-09-09 | Discharge: 2020-09-09 | Disposition: A | Discharge status: Home / Self Care | Payer: Self-pay | Attending: Urology | Admitting: Urology

## 2020-09-09 DIAGNOSIS — R972 Elevated prostate specific antigen [PSA]: Secondary | ICD-10-CM | POA: Insufficient documentation

## 2020-09-10 LAB — FPSA% REFLEX
% FREE PSA: 10.9 %
PSA, FREE: 0.87 ng/mL

## 2020-09-10 LAB — PSA (REFLEX TO FREE) (SERIAL): Prostate Specific Ag, Serum: 8 ng/mL — ABNORMAL HIGH (ref 0.0–4.0)

## 2020-09-16 ENCOUNTER — Ambulatory Visit (INDEPENDENT_AMBULATORY_CARE_PROVIDER_SITE_OTHER): Payer: Self-pay | Admitting: Urology

## 2020-09-16 ENCOUNTER — Other Ambulatory Visit: Payer: Self-pay

## 2020-09-16 ENCOUNTER — Encounter: Payer: Self-pay | Admitting: Urology

## 2020-09-16 VITALS — BP 121/83 | HR 76 | Ht 73.0 in | Wt 195.0 lb

## 2020-09-16 DIAGNOSIS — N529 Male erectile dysfunction, unspecified: Secondary | ICD-10-CM

## 2020-09-16 DIAGNOSIS — Z125 Encounter for screening for malignant neoplasm of prostate: Secondary | ICD-10-CM

## 2020-09-16 DIAGNOSIS — R972 Elevated prostate specific antigen [PSA]: Secondary | ICD-10-CM

## 2020-09-16 DIAGNOSIS — N401 Enlarged prostate with lower urinary tract symptoms: Secondary | ICD-10-CM

## 2020-09-16 DIAGNOSIS — N138 Other obstructive and reflux uropathy: Secondary | ICD-10-CM

## 2020-09-16 LAB — BLADDER SCAN AMB NON-IMAGING

## 2020-09-16 MED ORDER — TAMSULOSIN HCL 0.4 MG PO CAPS: 0.4000 mg | ORAL_CAPSULE | Freq: Every day | ORAL | 11 refills | Status: DC

## 2020-09-16 MED ORDER — TADALAFIL 20 MG PO TABS: 20.0000 mg | ORAL_TABLET | Freq: Every day | ORAL | 11 refills | Status: DC | PRN

## 2020-09-16 MED ORDER — DIAZEPAM 5 MG PO TABS: 5.0000 mg | ORAL_TABLET | Freq: Once | ORAL | 0 refills | Status: DC | PRN

## 2020-09-16 NOTE — Patient Instructions (Signed)
Prostatic Urethral Lift Prostatic urethral lift is a surgical procedure to relieve symptoms of prostate gland enlargement that occurs with age (benign prostatic hypertrophy, BPH). The part of the body that drains urine from the bladder (urethra) passes between the two lobes of the prostate. As the prostate enlarges, it can push on the urethra and cause problems with urinating. This procedure involves placing an implant that holds the prostate away from the urethra. The procedure is performed with a thin operating telescope (cystoscope) that is inserted through the tip of the penis and moved up the urethra to the prostate. This is less invasive than other procedures that require an incision. You may have this procedure if: You have symptoms of BPH. Your prostate is not severely enlarged. Medicines to treat BPH are not working or not tolerated. You want to avoid possible sexual side effects from medicines or other procedures that are used to treat BPH. Tell a health care provider about: Any allergies you have. All medicines you are taking, including vitamins, herbs, eye drops, creams, and over-the-counter medicines. Previous problems you or members of your family have had with the use of anesthetics. Any blood disorders you have. Previous surgeries you have had. Any medical conditions you have. What are the risks? Bleeding. Infection. Leaking of urine (incontinence). Allergic reactions to medicines. Return of BPH symptoms after 2 years, requiring more treatment. What happens before the procedure? Ask your health care provider about: Changing or stopping your regular medicines. This is especially important if you are taking diabetes medicines or blood thinners. Taking medicines such as aspirin and ibuprofen. These medicines can thin your blood. Do not take these medicines before your procedure if your health care provider instructs you not to. Follow instructions from your health care provider  about eating or drinking restrictions. Plan to have someone take you home from the hospital or clinic. What happens during the procedure? To lower your risk of infection: Your health care team will wash or sanitize their hands. Your skin will be washed with soap. You may be given an antibiotic medicine. An IV may be inserted into one of your veins. You will be given one or more of the following: A medicine to help you relax (sedative). A medicine that is injected into your urethra to numb the area (local anesthetic). A medicine to make you fall asleep (general anesthetic). A cystoscope will be inserted into your penis and moved through your urethra to your prostate. A device will be inserted through the cystoscope and used to press the lobes of your prostate away from your urethra. Implants will be inserted through the device to hold the lobes of your prostate in the widened position. The device and cystoscope will be removed. The procedure may vary among health care providers and hospitals. What happens after the procedure? Your blood pressure, heart rate, breathing rate, and blood oxygen level will be monitored until the medicines you were given have worn off. Do not drive for 24 hours if you were given a sedative. Summary Prostatic urethral lift is a surgical procedure to relieve symptoms of prostate gland enlargement that occurs with age (benign prostatic hypertrophy, BPH). The procedure is performed with a thin operating telescope (cystoscope) that is inserted through the tip of the penis and moved up the part of the body that drains urine from the bladder (urethra) to reach the prostate. This is less invasive than other procedures that require an incision. Plan to have someone take you home from the hospital  or clinic. You will not be allowed to drive for 24 hours if you were given a sedative during the procedure. This information is not intended to replace advice given to you by your  health care provider. Make sure you discuss any questions you have with your health care provider. Document Revised: 04/09/2020 Document Reviewed: 09/06/2019 Elsevier Patient Education  2022 [employer]  ----------------------------------------------------------------------------------------------------------------------------  Holmium Laser Enucleation of the Prostate (HoLEP)  HoLEP is a treatment for men with benign prostatic hyperplasia (BPH). The laser surgery removed blockages of urine flow, and is done without any incisions on the body.     What is HoLEP?  HoLEP is a type of laser surgery used to treat obstruction (blockage) of urine flow as a result of benign prostatic hyperplasia (BPH). In men with BPH, the prostate gland is not cancerous, but has become enlarged. An enlarged prostate can result in a number of urinary tract symptoms such as weak urinary stream, difficulty in starting urination, inability to urinate, frequent urination, or getting up at night to urinate.  HoLEP was developed in the 1990's as a more effective and less expensive surgical option for BPH, compared to other surgical options such as laser vaporization(PVP/greenlight laser), transurethral resection of the prostate(TURP), and open simple prostatectomy.   What happens during a HoLEP?  HoLEP requires general anesthesia ("asleep" throughout the procedure).   An antibiotic is given to reduce the risk of infection  A surgical instrument called a resectoscope is inserted through the urethra (the tube that carries urine from the bladder). The resectoscope has a camera that allows the surgeon to view the internal structure of the prostate gland, and to see where the incisions are being made during surgery.  The laser is inserted into the resectoscope and is used to enucleate (free up) the enlarged prostate tissue from the capsule (outer shell) and then to seal up any blood vessels. The tissue that has been removed  is pushed back into the bladder.  A morcellator is placed through the resectoscope, and is used to suction out the prostate tissue that has been pushed into the bladder.  When the prostate tissue has been removed, the resectoscope is removed, and a foley catheter is placed to allow healing and drain the urine from the bladder.     What happens after a HoLEP?  More than 90% of patients go home the same day a few hours after surgery. Less than 10% will be admitted to the hospital overnight for observation to monitor the urine, or if they have other medical problems.  Fluid is flushed through the catheter for about 1 hour after surgery to clear any blood from the urine. It is normal to have some blood in the urine after surgery. The need for blood transfusion is extremely rare.  Eating and drinking are permitted after the procedure once the patient has fully awakened from anesthesia.  The catheter is usually removed 2-3 days after surgery- the patient will come to clinic to have the catheter removed and make sure they can urinate on their own.  It is very important to drink lots of fluids after surgery for one week to keep the bladder flushed.  At first, there may be some burning with urination, but this typically improved within a few hours to days. Most patients do not have a significant amount of pain, and narcotic pain medications are rarely needed.  Symptoms of urinary frequency, urgency, and even leakage are NORMAL for the first few weeks  after surgery as the bladder adjusts after having to work hard against blockage from the prostate for many years. This will improve, but can sometimes take several months.  The use of pelvic floor exercises (Kegel exercises) can help improve problems with urinary incontinence.   After catheter removal, patients will be seen at 6 weeks and 6 months for symptom check  No heavy lifting for at least 2-3 weeks after surgery, however patients can walk and  do light activities the first day after surgery. Return to work time depends on occupation.    What are the advantages of HoLEP?  HoLEP has been studied in many different parts of the world and has been shown to be a safe and effective procedure. Although there are many types of BPH surgeries available, HoLEP offers a unique advantage in being able to remove a large amount of tissue without any incisions on the body, even in very large prostates, while decreasing the risk of bleeding and providing tissue for pathology (to look for cancer). This decreases the need for blood transfusions during surgery, minimizes hospital stay, and reduces the risk of needing repeat treatment.  What are the side effects of HoLEP?  Temporary burning and bleeding during urination. Some blood may be seen in the urine for weeks after surgery and is part of the healing process.  Urinary incontinence (inability to control urine flow) is expected in all patients immediately after surgery and they should wear pads for the first few days/weeks. This typically improves over the course of several weeks. Performing Kegel exercises can help decrease leakage from stress maneuvers such as coughing, sneezing, or lifting. The rate of long term leakage is very low. Patients may also have leakage with urgency and this may be treated with medication. The risk of urge incontinence can be dependent on several factors including age, prostate size, symptoms, and other medical problems.  Retrograde ejaculation or "backwards ejaculation." In 75% of cases, the patient will not see any fluid during ejaculation after surgery.  Erectile function is generally not significantly affected.   What are the risks of HoLEP?  Injury to the urethra or development of scar tissue at a later date  Injury to the capsule of the prostate (typically treated with longer catheterization).  Injury to the bladder or ureteral orifices (where the urine from the  kidney drains out)  Infection of the bladder, testes, or kidneys  Return of urinary obstruction at a later date requiring another operation (<2%)  Need for blood transfusion or re-operation due to bleeding  Failure to relieve all symptoms and/or need for prolonged catheterization after surgery  5-15% of patients are found to have previously undiagnosed prostate cancer in their specimen. Prostate cancer can be treated after HoLEP.  Standard risks of anesthesia including blood clots, heart attacks, etc  When should I call my doctor?  Fever over 101.3 degrees  Inability to urinate, or large blood clots in the urine

## 2020-09-16 NOTE — Progress Notes (Signed)
   09/16/2020 2:56 PM   Keawe Marcello 1965-02-10 [id number]  Reason for visit: Follow up elevated PSA, BPH, ED  HPI: 55 year old male who underwent a negative prostate biopsy in August 2021 for an elevated PSA of 12.5, which showed a 70 g prostate.  He also has BPH on Flomax with primary complaint of weak stream and feeling of incomplete emptying. If he stops the Flomax he notices significant worsening of his urinary symptoms. I recommended close follow-up in 3 months for repeat PSA and consideration of prostate MRI, but he did not follow-up.  He has ED that is well controlled currently on Cialis 20 mg daily.    He reports no major changes over the last year aside from some worsening of his urinary symptoms with weaker stream.  PVR is mildly elevated at 220 mL in clinic today.  PSA is 8(10.9% free) which is down from 12.5 last year.  We reviewed the risks and benefits and controversies surrounding PSA screening.  He is primarily interested in discussing outlet procedures today, as he continues to be bothered by his urination.  I recommended cystoscopy for better evaluation of prostate anatomy prior to undergoing any surgery, but he would likely be a good candidate for UroLift or HOLEP, and we reviewed the risks and benefits of these procedures at length.  -Flomax and Cialis refilled -Follow-up for cystoscopy for better evaluation of UroLift versus HOLEP for BPH  Sondra Come, MD  [facility] [address], [address] [place], [place] [address] [phone]

## 2020-11-27 ENCOUNTER — Other Ambulatory Visit: Payer: Self-pay | Admitting: Urology

## 2020-11-27 ENCOUNTER — Encounter: Payer: Self-pay | Admitting: Urology

## 2021-09-15 ENCOUNTER — Ambulatory Visit (INDEPENDENT_AMBULATORY_CARE_PROVIDER_SITE_OTHER): Payer: BC Managed Care – PPO | Admitting: Urology

## 2021-09-15 ENCOUNTER — Encounter: Payer: Self-pay | Admitting: Urology

## 2021-09-15 VITALS — BP 154/90 | HR 78 | Ht 73.0 in | Wt 204.8 lb

## 2021-09-15 DIAGNOSIS — N138 Other obstructive and reflux uropathy: Secondary | ICD-10-CM | POA: Diagnosis not present

## 2021-09-15 DIAGNOSIS — N401 Enlarged prostate with lower urinary tract symptoms: Secondary | ICD-10-CM

## 2021-09-15 DIAGNOSIS — N529 Male erectile dysfunction, unspecified: Secondary | ICD-10-CM

## 2021-09-15 DIAGNOSIS — R972 Elevated prostate specific antigen [PSA]: Secondary | ICD-10-CM

## 2021-09-15 MED ORDER — TAMSULOSIN HCL 0.4 MG PO CAPS: 0.4000 mg | ORAL_CAPSULE | Freq: Every day | ORAL | 11 refills | Status: DC

## 2021-09-15 MED ORDER — TADALAFIL 20 MG PO TABS: 20.0000 mg | ORAL_TABLET | Freq: Every day | ORAL | 11 refills | Status: DC | PRN

## 2021-09-15 NOTE — Patient Instructions (Signed)
Holmium Laser Enucleation of the Prostate (HoLEP)  HoLEP is a treatment for men with benign prostatic hyperplasia (BPH). The laser surgery removed blockages of urine flow, and is done without any incisions on the body.     What is HoLEP?  HoLEP is a type of laser surgery used to treat obstruction (blockage) of urine flow as a result of benign prostatic hyperplasia (BPH). In men with BPH, the prostate gland is not cancerous, but has become enlarged. An enlarged prostate can result in a number of urinary tract symptoms such as weak urinary stream, difficulty in starting urination, inability to urinate, frequent urination, or getting up at night to urinate.  HoLEP was developed in the 1990's as a more effective and less expensive surgical option for BPH, compared to other surgical options such as laser vaporization(PVP/greenlight laser), transurethral resection of the prostate(TURP), and open simple prostatectomy.   What happens during a HoLEP?  HoLEP requires general anesthesia ("asleep" throughout the procedure).   An antibiotic is given to reduce the risk of infection  A surgical instrument called a resectoscope is inserted through the urethra (the tube that carries urine from the bladder). The resectoscope has a camera that allows the surgeon to view the internal structure of the prostate gland, and to see where the incisions are being made during surgery.  The laser is inserted into the resectoscope and is used to enucleate (free up) the enlarged prostate tissue from the capsule (outer shell) and then to seal up any blood vessels. The tissue that has been removed is pushed back into the bladder.  A morcellator is placed through the resectoscope, and is used to suction out the prostate tissue that has been pushed into the bladder.  When the prostate tissue has been removed, the resectoscope is removed, and a foley catheter is placed to allow healing and drain the urine from the  bladder.     What happens after a HoLEP?  More than 90% of patients go home the same day a few hours after surgery. Less than 10% will be admitted to the hospital overnight for observation to monitor the urine, or if they have other medical problems.  Fluid is flushed through the catheter for about 1 hour after surgery to clear any blood from the urine. It is normal to have some blood in the urine after surgery. The need for blood transfusion is extremely rare.  Eating and drinking are permitted after the procedure once the patient has fully awakened from anesthesia.  The catheter is usually removed 2-3 days after surgery- the patient will come to clinic to have the catheter removed and make sure they can urinate on their own.  It is very important to drink lots of fluids after surgery for one week to keep the bladder flushed.  At first, there may be some burning with urination, but this typically improved within a few hours to days. Most patients do not have a significant amount of pain, and narcotic pain medications are rarely needed.  Symptoms of urinary frequency, urgency, and even leakage are NORMAL for the first few weeks after surgery as the bladder adjusts after having to work hard against blockage from the prostate for many years. This will improve, but can sometimes take several months.  The use of pelvic floor exercises (Kegel exercises) can help improve problems with urinary incontinence.   After catheter removal, patients will be seen at 6 weeks and 6 months for symptom check  No heavy lifting for   at least 2-3 weeks after surgery, however patients can walk and do light activities the first day after surgery. Return to work time depends on occupation.    What are the advantages of HoLEP?  HoLEP has been studied in many different parts of the world and has been shown to be a safe and effective procedure. Although there are many types of BPH surgeries available, HoLEP offers a  unique advantage in being able to remove a large amount of tissue without any incisions on the body, even in very large prostates, while decreasing the risk of bleeding and providing tissue for pathology (to look for cancer). This decreases the need for blood transfusions during surgery, minimizes hospital stay, and reduces the risk of needing repeat treatment.  What are the side effects of HoLEP?  Temporary burning and bleeding during urination. Some blood may be seen in the urine for weeks after surgery and is part of the healing process.  Urinary incontinence (inability to control urine flow) is expected in all patients immediately after surgery and they should wear pads for the first few days/weeks. This typically improves over the course of several weeks. Performing Kegel exercises can help decrease leakage from stress maneuvers such as coughing, sneezing, or lifting. The rate of long term leakage is very low. Patients may also have leakage with urgency and this may be treated with medication. The risk of urge incontinence can be dependent on several factors including age, prostate size, symptoms, and other medical problems.  Retrograde ejaculation or "backwards ejaculation." In 75% of cases, the patient will not see any fluid during ejaculation after surgery.  Erectile function is generally not significantly affected.   What are the risks of HoLEP?  Injury to the urethra or development of scar tissue at a later date  Injury to the capsule of the prostate (typically treated with longer catheterization).  Injury to the bladder or ureteral orifices (where the urine from the kidney drains out)  Infection of the bladder, testes, or kidneys  Return of urinary obstruction at a later date requiring another operation (<2%)  Need for blood transfusion or re-operation due to bleeding  Failure to relieve all symptoms and/or need for prolonged catheterization after surgery  5-15% of patients are  found to have previously undiagnosed prostate cancer in their specimen. Prostate cancer can be treated after HoLEP.  Standard risks of anesthesia including blood clots, heart attacks, etc  When should I call my doctor?  Fever over 101.3 degrees  Inability to urinate, or large blood clots in the urine   Prostatic Urethral Lift  Prostatic urethral lift is a surgical procedure to treat symptoms of prostate gland enlargement that occurs with age (benign prostatic hypertrophy, BPH). The urethra passes between the two lobes of the prostate. The urethra is the part of the body that drains urine from the bladder. As the prostate enlarges, it can push on the urethra and cause problems with urinating. This procedure involves placing an implant that holds the prostate away from the urethra. The procedure is done using a thin device called a cystoscope. The device is inserted through the tip of the penis and moved up the urethra to the prostate. This is less invasive than other procedures that require an incision. You may have this procedure if: You have symptoms of BPH. Your prostate is not severely enlarged. Medicines to treat BPH are not working or not tolerated. You want to avoid possible sexual side effects from medicines or other procedures that   are used to treat BPH. Tell a health care provider about: Any allergies you have. All medicines you are taking, including vitamins, herbs, eye drops, creams, and over-the-counter medicines. Any problems you or family members have had with anesthetic medicines. Any bleeding problems you have. Any surgeries you have had. Any medical conditions you have. What are the risks? Generally, this is a safe procedure. However, problems may occur, including: Bleeding. Infection. Leaking of urine (incontinence). Allergic reactions to medicines. Return of BPH symptoms after 2 years, requiring more treatment. What happens before the procedure? When to stop  eating and drinking Follow instructions from your health care provider about what you may eat and drink before your procedure. These may include: 8 hours before your procedure Stop eating most foods. Do not eat meat, fried foods, or fatty foods. Eat only light foods, such as toast or crackers. All liquids are okay except energy drinks and alcohol. 6 hours before your procedure Stop eating. Drink only clear liquids, such as water, clear fruit juice, black coffee, plain tea, and sports drinks. Do not drink energy drinks or alcohol. 2 hours before your procedure Stop drinking all liquids. You may be allowed to take medicines with small sips of water. If you do not follow your health care provider's instructions, your procedure may be delayed or canceled. Medicines Ask your health care provider about: Changing or stopping your regular medicines. This is especially important if you are taking diabetes medicines or blood thinners. Taking medicines such as aspirin and ibuprofen. These medicines can thin your blood. Do not take these medicines unless your health care provider tells you to take them. Taking over-the-counter medicines, vitamins, herbs, and supplements. Surgery safety Ask your health care provider what steps will be taken to help prevent infection. These steps may include: Removing hair at the surgery site. Washing skin with a germ-killing soap. Taking antibiotic medicine. General instructions Do not use any products that contain nicotine or tobacco for at least 4 weeks before the procedure. These products include cigarettes, chewing tobacco, and vaping devices, such as e-cigarettes. If you need help quitting, ask your health care provider. If you will be going home right after the procedure, plan to have a responsible adult: Take you home from the hospital or clinic. You will not be allowed to drive. Care for you for the time you are told. What happens during the procedure? An IV  may be inserted into one of your veins. You will be given one or more of the following: A medicine to help you relax (sedative). A medicine that is injected into your urethra to numb the area (local anesthetic). A medicine to make you fall asleep (general anesthetic). A cystoscope will be inserted into your penis and moved through your urethra to your prostate. A device will be inserted through the cystoscope and used to press the lobes of your prostate away from your urethra. Implants will be inserted through the device to hold the lobes of your prostate in the widened position. The device and cystoscope will be removed. The procedure may vary among health care providers and hospitals. What happens after the procedure? Your blood pressure, heart rate, breathing rate, and blood oxygen level be monitored until you leave the hospital or clinic. If you were given a sedative during the procedure, it can affect you for several hours. Do not drive or operate machinery until your health care provider says that it is safe. Summary Prostatic urethral lift is a surgical procedure to relieve   symptoms of prostate gland enlargement that occurs with age (benign prostatic hypertrophy, BPH). The procedure is performed with a thin device called a cystoscope. This device is inserted through the tip of the penis and moved up the urethra to reach the prostate. This is less invasive than other procedures that require an incision. If you will be going home right after the procedure, plan to have a responsible adult take you home from the hospital or clinic. You will not be allowed to drive. This information is not intended to replace advice given to you by your health care provider. Make sure you discuss any questions you have with your health care provider. Document Revised: 07/25/2020 Document Reviewed: 07/25/2020 Elsevier Patient Education  2023 [employer]  

## 2021-09-15 NOTE — Progress Notes (Signed)
   09/15/2021 2:34 PM   Tyler Pollard 1965/04/04 [id number]  Reason for visit: Follow up elevated PSA, BPH, ED  HPI: 56 year old male who underwent a negative prostate biopsy in August 2021 for an elevated PSA of 12.5, which showed a 70 g prostate.  He also has BPH on Flomax with primary complaint of weak stream and feeling of incomplete emptying. If he stops the Flomax he notices significant worsening of his urinary symptoms. I previously recommended considering prostate MRI and repeat PSA, but he deferred.  He reports no major changes over the last year.  On the Flomax he does well with the urination and really denies significant complaint, but if he stops the Flomax his stream is much weaker.  He denies any UTIs or gross hematuria.  I previously had recommended cystoscopy for consideration of an outlet procedure like UroLift or HOLEP, but he no showed.  We again reviewed cystoscopy and the importance of prostate anatomy prior to deciding on a outlet procedure, as well as the risks and benefits of UroLift versus HOLEP discussed extensively.  We discussed the risks and benefits of HoLEP at length.  The procedure requires general anesthesia and takes 1 to 2 hours, and a holmium laser is used to enucleate the prostate and push this tissue into the bladder.  A morcellator is then used to remove this tissue, which is sent for pathology.  The vast majority(>95%) of patients are able to discharge the same day with a catheter in place for 2 to 3 days, and will follow-up in clinic for a voiding trial.  We specifically discussed the risks of bleeding, infection, retrograde ejaculation, temporary urgency and urge incontinence, very low risk of long-term incontinence, urethral stricture/bladder neck contracture, pathologic evaluation of prostate tissue and possible detection of prostate cancer or other malignancy, and possible need for additional procedures.  He continues on Cialis 20 mg as needed for ED with good  results  -Flomax and Cialis refilled -Return precautions discussed at length -RTC 1 year PSA reflex to free prior, PVR  Sondra Come, MD  [facility] [address], [address] [place], [place] [address] [phone]

## 2022-09-21 ENCOUNTER — Encounter: Payer: Self-pay | Admitting: Urology

## 2022-09-21 ENCOUNTER — Ambulatory Visit (INDEPENDENT_AMBULATORY_CARE_PROVIDER_SITE_OTHER): Payer: Self-pay | Admitting: Urology

## 2022-09-21 VITALS — BP 158/99 | HR 90 | Ht 73.0 in | Wt 202.8 lb

## 2022-09-21 DIAGNOSIS — R972 Elevated prostate specific antigen [PSA]: Secondary | ICD-10-CM

## 2022-09-21 DIAGNOSIS — N529 Male erectile dysfunction, unspecified: Secondary | ICD-10-CM

## 2022-09-21 DIAGNOSIS — N138 Other obstructive and reflux uropathy: Secondary | ICD-10-CM

## 2022-09-21 DIAGNOSIS — N401 Enlarged prostate with lower urinary tract symptoms: Secondary | ICD-10-CM

## 2022-09-21 MED ORDER — TAMSULOSIN HCL 0.4 MG PO CAPS: 0.4000 mg | ORAL_CAPSULE | Freq: Every day | ORAL | 3 refills | Status: DC

## 2022-09-21 MED ORDER — TADALAFIL 20 MG PO TABS: 20.0000 mg | ORAL_TABLET | Freq: Every day | ORAL | 11 refills | Status: DC | PRN

## 2022-09-21 NOTE — Patient Instructions (Addendum)
If Cialis or Flomax are expensive at your local pharmacy, would recommend [email or web address], if you would like to change pharmacies just send [place] a MyChart message and we can send your prescriptions over   Holmium Laser Enucleation of the Prostate (HoLEP)  HoLEP is a treatment for men with benign prostatic hyperplasia (BPH). The laser surgery removed blockages of urine flow, and is done without any incisions on the body.     What is HoLEP?  HoLEP is a type of laser surgery used to treat obstruction (blockage) of urine flow as a result of benign prostatic hyperplasia (BPH). In men with BPH, the prostate gland is not cancerous, but has become enlarged. An enlarged prostate can result in a number of urinary tract symptoms such as weak urinary stream, difficulty in starting urination, inability to urinate, frequent urination, or getting up at night to urinate.  HoLEP was developed in the 1990's as a more effective and less expensive surgical option for BPH, compared to other surgical options such as laser vaporization(PVP/greenlight laser), transurethral resection of the prostate(TURP), and open simple prostatectomy.   What happens during a HoLEP?  HoLEP requires general anesthesia ("asleep" throughout the procedure).   An antibiotic is given to reduce the risk of infection  A surgical instrument called a resectoscope is inserted through the urethra (the tube that carries urine from the bladder). The resectoscope has a camera that allows the surgeon to view the internal structure of the prostate gland, and to see where the incisions are being made during surgery.  The laser is inserted into the resectoscope and is used to enucleate (free up) the enlarged prostate tissue from the capsule (outer shell) and then to seal up any blood vessels. The tissue that has been removed is pushed back into the bladder.  A morcellator is placed through the resectoscope, and is used to suction out the prostate  tissue that has been pushed into the bladder.  When the prostate tissue has been removed, the resectoscope is removed, and a foley catheter is placed to allow healing and drain the urine from the bladder.     What happens after a HoLEP?  More than 90% of patients go home the same day a few hours after surgery. Less than 10% will be admitted to the hospital overnight for observation to monitor the urine, or if they have other medical problems.  Fluid is flushed through the catheter for about 1 hour after surgery to clear any blood from the urine. It is normal to have some blood in the urine after surgery. The need for blood transfusion is extremely rare.  Eating and drinking are permitted after the procedure once the patient has fully awakened from anesthesia.  The catheter is usually removed 2-3 days after surgery- the patient will come to clinic to have the catheter removed and make sure they can urinate on their own.  It is very important to drink lots of fluids after surgery for one week to keep the bladder flushed.  At first, there may be some burning with urination, but this typically improved within a few hours to days. Most patients do not have a significant amount of pain, and narcotic pain medications are rarely needed.  Symptoms of urinary frequency, urgency, and even leakage are NORMAL for the first few weeks after surgery as the bladder adjusts after having to work hard against blockage from the prostate for many years. This will improve, but can sometimes take several months.  The use of  pelvic floor exercises (Kegel exercises) can help improve problems with urinary incontinence.   After catheter removal, patients will be seen at 6 weeks and 6 months for symptom check  No heavy lifting for at least 2-3 weeks after surgery, however patients can walk and do light activities the first day after surgery. Return to work time depends on occupation.    What are the advantages of  HoLEP?  HoLEP has been studied in many different parts of the world and has been shown to be a safe and effective procedure. Although there are many types of BPH surgeries available, HoLEP offers a unique advantage in being able to remove a large amount of tissue without any incisions on the body, even in very large prostates, while decreasing the risk of bleeding and providing tissue for pathology (to look for cancer). This decreases the need for blood transfusions during surgery, minimizes hospital stay, and reduces the risk of needing repeat treatment.  What are the side effects of HoLEP?  Temporary burning and bleeding during urination. Some blood may be seen in the urine for weeks after surgery and is part of the healing process.  Urinary incontinence (inability to control urine flow) is expected in all patients immediately after surgery and they should wear pads for the first few days/weeks. This typically improves over the course of several weeks. Performing Kegel exercises can help decrease leakage from stress maneuvers such as coughing, sneezing, or lifting. The rate of long term leakage is very low. Patients may also have leakage with urgency and this may be treated with medication. The risk of urge incontinence can be dependent on several factors including age, prostate size, symptoms, and other medical problems.  Retrograde ejaculation or "backwards ejaculation." In 75% of cases, the patient will not see any fluid during ejaculation after surgery.  Erectile function is generally not significantly affected.   What are the risks of HoLEP?  Injury to the urethra or development of scar tissue at a later date  Injury to the capsule of the prostate (typically treated with longer catheterization).  Injury to the bladder or ureteral orifices (where the urine from the kidney drains out)  Infection of the bladder, testes, or kidneys  Return of urinary obstruction at a later date requiring  another operation (<2%)  Need for blood transfusion or re-operation due to bleeding  Failure to relieve all symptoms and/or need for prolonged catheterization after surgery  5-15% of patients are found to have previously undiagnosed prostate cancer in their specimen. Prostate cancer can be treated after HoLEP.  Standard risks of anesthesia including blood clots, heart attacks, etc  When should I call my doctor?  Fever over 101.3 degrees  Inability to urinate, or large blood clots in the urine

## 2022-09-21 NOTE — Progress Notes (Signed)
   09/21/2022 4:09 PM   Tyler Pollard 1965-01-30 [id number]  Reason for visit: Follow up elevated PSA, BPH and urinary symptoms, ED  HPI: 57 year old male who originally presented with an elevated PSA of 12.5 and underwent a prostate biopsy in August 2021 that showed only benign tissue and a 70 g prostate.  He also has obstructive urinary symptoms on Flomax with severe weak stream and worsening symptoms when he comes off Flomax.  We have previously discussed consideration of outlet procedures but he has deferred multiple times.  He denies any major changes in the urination and continues on Flomax.  He also has ED that is well-controlled with Cialis 20 mg on demand.  Most recent PSA from August 2022 8.0 which decreased from 12.5 at time of negative biopsy.  Will recheck PSA in the next week, counseled to avoid ejaculations 2 to 3 days prior to PSA, call with results.  We again reviewed outlet procedures like HOLEP, but he would like to continue Flomax at this time.  Return precautions discussed at length.  Cialis and Flomax refilled Will call with PSA results RTC yearly for PVR, PSA screening  Sondra Come, MD  [facility] [address], [address] [place], [place] [address] [phone]

## 2023-08-08 ENCOUNTER — Other Ambulatory Visit: Payer: Self-pay | Admitting: Urology

## 2023-08-08 DIAGNOSIS — N138 Other obstructive and reflux uropathy: Secondary | ICD-10-CM

## 2023-09-20 ENCOUNTER — Ambulatory Visit: Payer: Self-pay | Admitting: Urology

## 2023-09-20 ENCOUNTER — Other Ambulatory Visit
Admission: RE | Admit: 2023-09-20 | Discharge: 2023-09-20 | Disposition: A | Discharge status: Home / Self Care | Attending: Urology | Admitting: Urology

## 2023-09-20 VITALS — BP 138/105 | HR 94 | Wt 200.0 lb

## 2023-09-20 DIAGNOSIS — N138 Other obstructive and reflux uropathy: Secondary | ICD-10-CM | POA: Diagnosis not present

## 2023-09-20 DIAGNOSIS — R972 Elevated prostate specific antigen [PSA]: Secondary | ICD-10-CM

## 2023-09-20 DIAGNOSIS — N529 Male erectile dysfunction, unspecified: Secondary | ICD-10-CM

## 2023-09-20 DIAGNOSIS — N401 Enlarged prostate with lower urinary tract symptoms: Secondary | ICD-10-CM | POA: Insufficient documentation

## 2023-09-20 LAB — BLADDER SCAN AMB NON-IMAGING

## 2023-09-20 MED ORDER — TAMSULOSIN HCL 0.4 MG PO CAPS: 0.4000 mg | ORAL_CAPSULE | Freq: Every day | ORAL | 3 refills | Status: AC

## 2023-09-20 MED ORDER — TAMSULOSIN HCL 0.4 MG PO CAPS: 0.4000 mg | ORAL_CAPSULE | Freq: Every day | ORAL | 3 refills | Status: DC

## 2023-09-20 MED ORDER — TADALAFIL 10 MG PO TABS: 10.0000 mg | ORAL_TABLET | Freq: Every day | ORAL | 6 refills | Status: DC

## 2023-09-20 NOTE — Addendum Note (Signed)
 Addended by: ELOUISE SANTA BROCKS on: 09/20/2023 01:50 PM   Modules accepted: Orders

## 2023-09-20 NOTE — Progress Notes (Signed)
   09/20/2023 1:14 PM   Tyler Pollard [birth year] [id number]  Reason for visit: Follow up elevated PSA, BPH  History: Elevated PSA 12.5 in August 2021 and biopsy showed benign tissue.  Prostate Repeat PSA decreased 8, he did not follow-up for PSA the last 2 years Flomax  for obstructive urinary symptoms, severe worsening if misses dose, deferred outlet procedures multiple times previously, PVRs have been normal ED moderately controlled with Cialis  20 mg on demand  Physical Exam: BP (!) 138/105 (BP Location: Left Arm, Patient Position: Sitting, Cuff Size: Normal)   Pulse 94   Wt 200 lb (90.7 kg)   SpO2 96%   BMI 26.39 kg/m   Imaging/labs: PSA today pending  Today: Urinary symptoms may be slightly worse with weak stream, still not interested in outlet procedures, PVR normal at 99ml.  Denies incontinence or dysuria Moderate results on Cialis  PRN  Plan:   We reviewed the AUA guidelines regarding the risks and benefits of PSA screening, I recommended a repeat PSA today with reflex to free, if significantly elevated greater than 15 would recommend considering a prostate MRI. In terms of his ED, recommended trialing Cialis  10 mg daily with 10 mg boost dose as needed, this may also improve his urinary symptoms If no improvement in urination with the daily Cialis , can try 0.8 mg dose Flomax  or 0.4 twice daily Will contact with PSA results All medications sent to [email or web address]   Tyler JAYSON Burnet, MD  [facility] [address] [place], [place] [address] [phone]

## 2023-09-20 NOTE — Patient Instructions (Signed)
 Your medication has been sent to [facility]. This is an Therapist, occupational that offers medication at a significantly discounted price. You will need to go to the website ([email or web address]) to sign up for an account, give your address and enter your payment method.

## 2023-09-21 ENCOUNTER — Ambulatory Visit: Payer: Self-pay | Admitting: Urology

## 2023-09-21 LAB — PSA, TOTAL AND FREE
PSA, Free Pct: 20 %
PSA, Free: 0.8 ng/mL
Prostate Specific Ag, Serum: 4 ng/mL (ref 0.0–4.0)

## 2023-09-23 ENCOUNTER — Encounter: Payer: Self-pay | Admitting: Emergency Medicine

## 2023-09-23 ENCOUNTER — Ambulatory Visit
Admission: EM | Admit: 2023-09-23 | Discharge: 2023-09-23 | Disposition: A | Discharge status: Home / Self Care | Source: Home / Self Care

## 2023-09-23 DIAGNOSIS — M79671 Pain in right foot: Secondary | ICD-10-CM | POA: Insufficient documentation

## 2023-09-23 DIAGNOSIS — G8929 Other chronic pain: Secondary | ICD-10-CM | POA: Insufficient documentation

## 2023-09-23 DIAGNOSIS — M79672 Pain in left foot: Secondary | ICD-10-CM | POA: Insufficient documentation

## 2023-09-23 DIAGNOSIS — M79652 Pain in left thigh: Secondary | ICD-10-CM | POA: Insufficient documentation

## 2023-09-23 DIAGNOSIS — R21 Rash and other nonspecific skin eruption: Secondary | ICD-10-CM | POA: Diagnosis not present

## 2023-09-23 DIAGNOSIS — M545 Low back pain, unspecified: Secondary | ICD-10-CM | POA: Insufficient documentation

## 2023-09-23 DIAGNOSIS — M79651 Pain in right thigh: Secondary | ICD-10-CM | POA: Insufficient documentation

## 2023-09-23 DIAGNOSIS — A419 Sepsis, unspecified organism: Secondary | ICD-10-CM | POA: Diagnosis not present

## 2023-09-23 LAB — CBC WITH DIFFERENTIAL/PLATELET
Abs Immature Granulocytes: 0.04 K/uL (ref 0.00–0.07)
Basophils Absolute: 0.1 K/uL (ref 0.0–0.1)
Basophils Relative: 1 %
Eosinophils Absolute: 0.2 K/uL (ref 0.0–0.5)
Eosinophils Relative: 2 %
HCT: 46 % (ref 39.0–52.0)
Hemoglobin: 16.5 g/dL (ref 13.0–17.0)
Immature Granulocytes: 1 %
Lymphocytes Relative: 8 %
Lymphs Abs: 0.6 K/uL — ABNORMAL LOW (ref 0.7–4.0)
MCH: 32.2 pg (ref 26.0–34.0)
MCHC: 35.9 g/dL (ref 30.0–36.0)
MCV: 89.8 fL (ref 80.0–100.0)
Monocytes Absolute: 0.8 K/uL (ref 0.1–1.0)
Monocytes Relative: 10 %
Neutro Abs: 6.5 K/uL (ref 1.7–7.7)
Neutrophils Relative %: 78 %
Platelets: 180 K/uL (ref 150–400)
RBC: 5.12 MIL/uL (ref 4.22–5.81)
RDW: 13.2 % (ref 11.5–15.5)
WBC: 8.2 K/uL (ref 4.0–10.5)
nRBC: 0 % (ref 0.0–0.2)

## 2023-09-23 LAB — COMPREHENSIVE METABOLIC PANEL WITH GFR
ALT: 42 U/L (ref 0–44)
AST: 31 U/L (ref 15–41)
Albumin: 4.9 g/dL (ref 3.5–5.0)
Alkaline Phosphatase: 65 U/L (ref 38–126)
Anion gap: 9 (ref 5–15)
BUN: 9 mg/dL (ref 6–20)
CO2: 24 mmol/L (ref 22–32)
Calcium: 8.9 mg/dL (ref 8.9–10.3)
Chloride: 99 mmol/L (ref 98–111)
Creatinine, Ser: 0.92 mg/dL (ref 0.61–1.24)
GFR, Estimated: >60 mL/min (ref >60)
Glucose, Bld: 103 mg/dL — ABNORMAL HIGH (ref 70–99)
Potassium: 3.9 mmol/L (ref 3.5–5.1)
Sodium: 132 mmol/L — ABNORMAL LOW (ref 135–145)
Total Bilirubin: 1.2 mg/dL (ref 0.0–1.2)
Total Protein: 8.4 g/dL — ABNORMAL HIGH (ref 6.5–8.1)

## 2023-09-23 LAB — VITAMIN B12: Vitamin B-12: 456 pg/mL (ref 180–914)

## 2023-09-23 LAB — C-REACTIVE PROTEIN: CRP: 13.6 mg/dL — ABNORMAL HIGH (ref <1.0)

## 2023-09-23 LAB — TSH: TSH: 1.836 u[IU]/mL (ref 0.350–4.500)

## 2023-09-23 LAB — CK: Total CK: 43 U/L — ABNORMAL LOW (ref 49–397)

## 2023-09-23 MED ORDER — PREDNISONE 10 MG PO TABS: ORAL_TABLET | ORAL | 0 refills | Status: DC

## 2023-09-23 MED ORDER — HYDROCODONE-ACETAMINOPHEN 5-325 MG PO TABS: 1.0000 | ORAL_TABLET | Freq: Four times a day (QID) | ORAL | 0 refills | Status: DC | PRN

## 2023-09-23 MED ORDER — KETOROLAC TROMETHAMINE 60 MG/2ML IM SOLN
30.0000 mg | Freq: Once | INTRAMUSCULAR | Status: AC
  Administered 2023-09-23: 30 mg via INTRAMUSCULAR

## 2023-09-23 NOTE — ED Provider Notes (Signed)
 [facility]    CSN: [id number] Arrival date & time: 09/23/23  1415      History   Chief Complaint Chief Complaint  Patient presents with   Leg Pain    bilateral    HPI Tyler Pollard is a 58 y.o. male with history of hypertension, hyperlipidemia, psoriasis, erectile dysfunction, GERD, tobacco abuse and alcohol abuse presents for bilateral thigh and bilateral foot pain x 3 days. No injuries or falls.  Patient says he went to see his urologist and had a ultrasound of his bladder performed as well as labs drawn to check PSA.  He says later that evening or the following day is when his pain began.  He reports taking a lot of Tylenol  for the pain and says it eased off the discomfort barely.  He says he currently has no pain in his back, hips, knees, calves, chest, abdomen.  He does report intermittent lumbar spinal pain over the past year. Increased pain with forward flexion. Denies any associated swelling, rashes, sores or lesions.  No numbness, weakness or tingling.  Patient says he has never experienced this type of discomfort before.  He has no history of peripheral artery disease, DVT, vitamin deficiency, electrolyte imbalances, restless leg syndrome, and does not take any statins.  Denies getting overheated before onset of symptoms.  No other complaints or concerns.  HPI  History reviewed. No pertinent past medical history.  Patient Active Problem List   Diagnosis Date Noted   Elevated PSA 07/17/2019   Colon cancer screening 06/17/2016   Psoriasis 05/01/2014   Essential hypertension 02/08/2012   Hyperlipidemia 02/08/2012   Skin tag 02/08/2012   Gastroesophageal reflux disease 02/18/2011   Erectile dysfunction 02/18/2011   Neck pain 02/18/2011   Nondependent alcohol abuse 02/18/2011   Seborrheic keratosis 02/18/2011    History reviewed. No pertinent surgical history.     Home Medications    Prior to Admission medications   Medication Sig Start Date End Date  Taking? Authorizing Provider  HYDROcodone -acetaminophen  (NORCO/VICODIN) 5-325 MG tablet Take 1 tablet by mouth every 6 (six) hours as needed for up to 3 days for severe pain (pain score 7-10). 09/23/23 09/26/23 Yes Arvis Jolan NOVAK, PA-C  omeprazole (PRILOSEC) 40 MG capsule  07/21/17  Yes [provider]  predniSONE  (DELTASONE ) 10 MG tablet Take 6 tabs po on day 1 and decrease by 1 tab daily until complete 09/23/23  Yes Arvis Jolan B, PA-C  tamsulosin  (FLOMAX ) 0.4 MG CAPS capsule Take 1 capsule (0.4 mg total) by mouth daily after supper. [email or web address] 09/20/23  Yes Francisca Redell BROCKS, MD  tadalafil  (CIALIS ) 10 MG tablet Take 1 tablet (10 mg total) by mouth daily. OK to take additional 10mg  1 hour prior to sexual activity 09/20/23   Francisca Redell BROCKS, MD    Family History History reviewed. No pertinent family history.  Social History Social History   Tobacco Use   Smoking status: Every Day    Current packs/day: 0.50    Average packs/day: 0.5 packs/day for 20.0 years (10.0 ttl pk-yrs)    Types: Cigarettes    Passive exposure: Current   Smokeless tobacco: Never  Vaping Use   Vaping status: Never Used  Substance Use Topics   Alcohol use: Yes   Drug use: Never     Allergies   Patient has no known allergies.   Review of Systems Review of Systems  Constitutional:  Negative for fatigue and fever.  Respiratory:  Negative for shortness of breath.  Cardiovascular:  Negative for chest pain.  Gastrointestinal:  Negative for abdominal pain.  Musculoskeletal:  Positive for arthralgias, back pain and myalgias. Negative for gait problem and joint swelling.  Skin:  Negative for color change, pallor, rash and wound.  Neurological:  Negative for dizziness, weakness and numbness.     Physical Exam Triage Vital Signs ED Triage Vitals  Encounter Vitals Group     BP 09/23/23 1430 (!) 140/91     Girls Systolic BP Percentile --      Girls Diastolic BP  Percentile --      Boys Systolic BP Percentile --      Boys Diastolic BP Percentile --      Pulse Rate 09/23/23 1430 97     Resp 09/23/23 1430 15     Temp 09/23/23 1430 98.6 F (37 C)     Temp Source 09/23/23 1430 Oral     SpO2 09/23/23 1430 95 %     Weight 09/23/23 1428 199 lb 15.3 oz (90.7 kg)     Height 09/23/23 1428 6' 1 (1.854 m)     Head Circumference --      Peak Flow --      Pain Score 09/23/23 1428 8     Pain Loc --      Pain Education --      Exclude from Growth Chart --    No data found.  Updated Vital Signs BP (!) 140/91 (BP Location: Left Arm)   Pulse 97   Temp 98.6 F (37 C) (Oral)   Resp 15   Ht 6' 1 (1.854 m)   Wt 199 lb 15.3 oz (90.7 kg)   SpO2 95%   BMI 26.38 kg/m      Physical Exam Vitals and nursing note reviewed.  Constitutional:      General: He is not in acute distress.    Appearance: Normal appearance. He is well-developed. He is not ill-appearing.  HENT:     Head: Normocephalic and atraumatic.  Eyes:     Conjunctiva/sclera: Conjunctivae normal.  Cardiovascular:     Rate and Rhythm: Normal rate and regular rhythm.     Pulses: Normal pulses.     Comments: Full symmetric pulses of lower extremities. No pallor of skin or hair loss on legs. Warm extremities. No wounds/ulcers.  Pulmonary:     Effort: Pulmonary effort is normal. No respiratory distress.     Breath sounds: Normal breath sounds.  Musculoskeletal:     Cervical back: Neck supple.     Comments: No tenderness of spine, low back, bilateral thighs, hips, knees or feet.  Skin:    General: Skin is warm and dry.     Capillary Refill: Capillary refill takes less than 2 seconds.  Neurological:     General: No focal deficit present.     Mental Status: He is alert. Mental status is at baseline.     Motor: No weakness.     Gait: Gait normal.  Psychiatric:        Mood and Affect: Mood normal.        Behavior: Behavior normal.      UC Treatments / Results  Labs (all labs ordered  are listed, but only abnormal results are displayed) Labs Reviewed  COMPREHENSIVE METABOLIC PANEL WITH GFR - Abnormal; Notable for the following components:      Result Value   Sodium 132 (*)    Glucose, Bld 103 (*)    Total Protein 8.4 (*)  All other components within normal limits  CBC WITH DIFFERENTIAL/PLATELET - Abnormal; Notable for the following components:   Lymphs Abs 0.6 (*)    All other components within normal limits  VITAMIN B12  TSH  C-REACTIVE PROTEIN  CK    EKG   Radiology No results found.  Procedures Procedures (including critical care time)  Medications Ordered in UC Medications  ketorolac  (TORADOL ) injection 30 mg (30 mg Intramuscular Given 09/23/23 1614)    Initial Impression / Assessment and Plan / UC Course  I have reviewed the triage vital signs and the nursing notes.  Pertinent labs & imaging results that were available during my care of the patient were reviewed by me and considered in my medical decision making (see chart for details).   58 year old male with history of hypertension, hyperlipidemia, tobacco abuse, alcohol abuse and psoriasis presents for evaluation of 3-day history of bilateral thigh and bilateral foot pain.  No injury.  No new medications.  Patient does state he has had intermittent lumbar spinal pain for the past year.  Blood pressure is mildly elevated at 140/90.  Other vitals are normal and stable and he is overall well-appearing.  On exam he has no tenderness of back or legs.  He has full and symmetric pulses of lower extremities, no wounds, no pallor of skin or hair loss.  DDx: Lumbar radiculopathy/pinched nerve/spinal stenosis, peripheral artery disease, electrolyte imbalance, muscle strain or spasm, restless leg syndrome, vitamin deficiency, thyroid abnormality, psoriatic arthritis, peripheral neuropathy, meralgia paresthetica   Will obtain labs: CBC, CMP, vitamin B12, TSH, CRP and CK.  CBC and CMP without any significant  abnormal findings.  Advised other results are pending.  We discussed obtaining an x-ray of his lower back and he is initially agreeable.  However, after about 10 minutes he informs nursing staff that his mother was in a car accident and he has to leave and will not be able to have the x-ray done at this time.  He was given 30 mg IM ketorolac  in clinic for acute pain relief.  Suspect his symptoms are related to lumbar radiculopathy.  Sent prednisone  taper for him to begin tomorrow as well as number 10 tablets of Norco for severe/breakthrough pain.  Encouraged him to make an appointment with primary care provider and showed him how to do so.  He scanned the QR code in the exam room to follow-up with PCP.  We also discussed orthopedic follow-up and reviewed ED precautions for back pain.  Will contact him with any other significantly abnormal results when they return.   Final Clinical Impressions(s) / UC Diagnoses   Final diagnoses:  Chronic midline low back pain without sciatica  Bilateral thigh pain  Bilateral foot pain     Discharge Instructions      - Many labs were obtained.  So far your CBC is normal and your metabolic panel is reassuring.  You have normal liver enzymes, normal kidney function and electrolytes are normal.  Some other tests are pending including thyroid test, inflammatory marker, muscle enzyme test and vitamin B12. - It seems that your symptoms are most consistent with lumbar radiculopathy.  This means your pain in your extremities is coming from a back/spine issue.  You should follow-up with your primary care as advised. - Also please consider following up with the [facility] especially if you are not feeling better over the next 1 week. - We gave you an injection of Toradol  in the clinic which is an anti-inflammatory medication.  You can start the prednisone  when you get it or may consider taking it tomorrow.  If you take it too late in the day it could keep you up at  night. - I sent a short supply of something for pain relief. - Take everything as directed and do not take more than it is prescribed. - Try heat, ice and rest. - Go to emergency department if you have acute worsening of pain, limb weakness, loss of bowel or bladder control.     ED Prescriptions     Medication Sig Dispense Auth. Provider   predniSONE  (DELTASONE ) 10 MG tablet Take 6 tabs po on day 1 and decrease by 1 tab daily until complete 21 tablet Arvis Huxley B, PA-C   HYDROcodone -acetaminophen  (NORCO/VICODIN) 5-325 MG tablet Take 1 tablet by mouth every 6 (six) hours as needed for up to 3 days for severe pain (pain score 7-10). 10 tablet Arvis Huxley NOVAK, PA-C      I have reviewed the PDMP during this encounter.   Arvis Huxley NOVAK, PA-C 09/23/23 1810

## 2023-09-23 NOTE — ED Triage Notes (Signed)
 Patient c/o bilateral leg pain and achy feet that started 3 days ago.  Patient denies injury or fall.  Patient has been taking Tylenol  for pain.

## 2023-09-23 NOTE — Discharge Instructions (Addendum)
-   Many labs were obtained.  So far your CBC is normal and your metabolic panel is reassuring.  You have normal liver enzymes, normal kidney function and electrolytes are normal.  Some other tests are pending including thyroid test, inflammatory marker, muscle enzyme test and vitamin B12. - It seems that your symptoms are most consistent with lumbar radiculopathy.  This means your pain in your extremities is coming from a back/spine issue.  You should follow-up with your primary care as advised. - Also please consider following up with the [facility] especially if you are not feeling better over the next 1 week. - We gave you an injection of Toradol  in the clinic which is an anti-inflammatory medication.  You can start the prednisone  when you get it or may consider taking it tomorrow.  If you take it too late in the day it could keep you up at night. - I sent a short supply of something for pain relief. - Take everything as directed and do not take more than it is prescribed. - Try heat, ice and rest. - Go to emergency department if you have acute worsening of pain, limb weakness, loss of bowel or bladder control.

## 2023-09-24 ENCOUNTER — Ambulatory Visit
Admission: RE | Admit: 2023-09-24 | Discharge: 2023-09-24 | Disposition: A | Discharge status: Home / Self Care | Source: Ambulatory Visit | Attending: Physician Assistant | Admitting: Physician Assistant

## 2023-09-24 ENCOUNTER — Other Ambulatory Visit: Payer: Self-pay

## 2023-09-24 ENCOUNTER — Emergency Department

## 2023-09-24 ENCOUNTER — Ambulatory Visit: Payer: Self-pay | Admitting: Physician Assistant

## 2023-09-24 ENCOUNTER — Inpatient Hospital Stay
Admission: EM | Admit: 2023-09-24 | Discharge: 2023-09-26 | DRG: 872 | Disposition: A | Discharge status: Home / Self Care | Attending: Internal Medicine | Admitting: Internal Medicine

## 2023-09-24 ENCOUNTER — Ambulatory Visit
Admission: RE | Admit: 2023-09-24 | Discharge: 2023-09-24 | Disposition: A | Discharge status: Home / Self Care | Source: Home / Self Care

## 2023-09-24 DIAGNOSIS — A419 Sepsis, unspecified organism: Secondary | ICD-10-CM | POA: Diagnosis not present

## 2023-09-24 DIAGNOSIS — R21 Rash and other nonspecific skin eruption: Principal | ICD-10-CM | POA: Diagnosis present

## 2023-09-24 DIAGNOSIS — E871 Hypo-osmolality and hyponatremia: Secondary | ICD-10-CM | POA: Diagnosis present

## 2023-09-24 DIAGNOSIS — R509 Fever, unspecified: Secondary | ICD-10-CM | POA: Diagnosis not present

## 2023-09-24 DIAGNOSIS — G8929 Other chronic pain: Secondary | ICD-10-CM | POA: Diagnosis present

## 2023-09-24 DIAGNOSIS — A770 Spotted fever due to Rickettsia rickettsii: Secondary | ICD-10-CM | POA: Diagnosis present

## 2023-09-24 DIAGNOSIS — F17210 Nicotine dependence, cigarettes, uncomplicated: Secondary | ICD-10-CM | POA: Diagnosis present

## 2023-09-24 DIAGNOSIS — Z1152 Encounter for screening for COVID-19: Secondary | ICD-10-CM

## 2023-09-24 DIAGNOSIS — I10 Essential (primary) hypertension: Secondary | ICD-10-CM | POA: Diagnosis present

## 2023-09-24 DIAGNOSIS — K219 Gastro-esophageal reflux disease without esophagitis: Secondary | ICD-10-CM | POA: Diagnosis present

## 2023-09-24 DIAGNOSIS — M79604 Pain in right leg: Secondary | ICD-10-CM

## 2023-09-24 DIAGNOSIS — E785 Hyperlipidemia, unspecified: Secondary | ICD-10-CM | POA: Diagnosis present

## 2023-09-24 DIAGNOSIS — R5081 Fever presenting with conditions classified elsewhere: Secondary | ICD-10-CM

## 2023-09-24 DIAGNOSIS — M5416 Radiculopathy, lumbar region: Secondary | ICD-10-CM | POA: Diagnosis present

## 2023-09-24 DIAGNOSIS — F1090 Alcohol use, unspecified, uncomplicated: Secondary | ICD-10-CM | POA: Diagnosis present

## 2023-09-24 DIAGNOSIS — Z79899 Other long term (current) drug therapy: Secondary | ICD-10-CM | POA: Diagnosis not present

## 2023-09-24 DIAGNOSIS — Z720 Tobacco use: Secondary | ICD-10-CM | POA: Diagnosis present

## 2023-09-24 LAB — CK: Total CK: 48 U/L — ABNORMAL LOW (ref 49–397)

## 2023-09-24 LAB — COMPREHENSIVE METABOLIC PANEL WITH GFR
ALT: 33 U/L (ref 0–44)
AST: 26 U/L (ref 15–41)
Albumin: 4.2 g/dL (ref 3.5–5.0)
Alkaline Phosphatase: 57 U/L (ref 38–126)
Anion gap: 11 (ref 5–15)
BUN: 10 mg/dL (ref 6–20)
CO2: 25 mmol/L (ref 22–32)
Calcium: 9.1 mg/dL (ref 8.9–10.3)
Chloride: 99 mmol/L (ref 98–111)
Creatinine, Ser: 0.97 mg/dL (ref 0.61–1.24)
GFR, Estimated: >60 mL/min (ref >60)
Glucose, Bld: 105 mg/dL — ABNORMAL HIGH (ref 70–99)
Potassium: 4.4 mmol/L (ref 3.5–5.1)
Sodium: 135 mmol/L (ref 135–145)
Total Bilirubin: 1.3 mg/dL — ABNORMAL HIGH (ref 0.0–1.2)
Total Protein: 7.2 g/dL (ref 6.5–8.1)

## 2023-09-24 LAB — RESPIRATORY PANEL BY PCR

## 2023-09-24 LAB — PROTIME-INR
INR: 1.2 (ref 0.8–1.2)
Prothrombin Time: 15.7 s — ABNORMAL HIGH (ref 11.4–15.2)

## 2023-09-24 LAB — CBC WITH DIFFERENTIAL/PLATELET
Abs Immature Granulocytes: 0.03 K/uL (ref 0.00–0.07)
Basophils Absolute: 0 K/uL (ref 0.0–0.1)
Basophils Relative: 1 %
Eosinophils Absolute: 0.2 K/uL (ref 0.0–0.5)
Eosinophils Relative: 2 %
HCT: 44.6 % (ref 39.0–52.0)
Hemoglobin: 15.6 g/dL (ref 13.0–17.0)
Immature Granulocytes: 0 %
Lymphocytes Relative: 9 %
Lymphs Abs: 0.7 K/uL (ref 0.7–4.0)
MCH: 32.4 pg (ref 26.0–34.0)
MCHC: 35 g/dL (ref 30.0–36.0)
MCV: 92.5 fL (ref 80.0–100.0)
Monocytes Absolute: 0.8 K/uL (ref 0.1–1.0)
Monocytes Relative: 9 %
Neutro Abs: 6.4 K/uL (ref 1.7–7.7)
Neutrophils Relative %: 79 %
Platelets: 172 K/uL (ref 150–400)
RBC: 4.82 MIL/uL (ref 4.22–5.81)
RDW: 13.1 % (ref 11.5–15.5)
WBC: 8.1 K/uL (ref 4.0–10.5)
nRBC: 0 % (ref 0.0–0.2)

## 2023-09-24 LAB — RAPID HIV SCREEN (HIV 1/2 AB+AG)
HIV 1/2 Antibodies: NONREACTIVE
HIV-1 P24 Antigen - HIV24: NONREACTIVE

## 2023-09-24 LAB — LACTIC ACID, PLASMA: Lactic Acid, Venous: 1.2 mmol/L (ref 0.5–1.9)

## 2023-09-24 LAB — APTT: aPTT: 44 s — ABNORMAL HIGH (ref 24–36)

## 2023-09-24 LAB — RESP PANEL BY RT-PCR (RSV, FLU A&B, COVID)  RVPGX2
Influenza A by PCR: NEGATIVE
Influenza B by PCR: NEGATIVE
Resp Syncytial Virus by PCR: NEGATIVE
SARS Coronavirus 2 by RT PCR: NEGATIVE

## 2023-09-24 LAB — ACETAMINOPHEN LEVEL: Acetaminophen (Tylenol), Serum: 16 ug/mL (ref 10–30)

## 2023-09-24 LAB — SEDIMENTATION RATE: Sed Rate: 20 mm/h (ref 0–20)

## 2023-09-24 LAB — HEMOGLOBIN A1C
Hgb A1c MFr Bld: 5.2 % (ref 4.8–5.6)
Mean Plasma Glucose: 102.54 mg/dL

## 2023-09-24 LAB — MONONUCLEOSIS SCREEN: Mono Screen: NEGATIVE

## 2023-09-24 LAB — TSH: TSH: 1.045 u[IU]/mL (ref 0.350–4.500)

## 2023-09-24 MED ORDER — SODIUM CHLORIDE 0.9 % IV SOLN
100.0000 mg | Freq: Two times a day (BID) | INTRAVENOUS | Status: DC
  Administered 2023-09-25 – 2023-09-26 (×4): 100 mg via INTRAVENOUS
  Filled 2023-09-24 (×5): qty 100

## 2023-09-24 MED ORDER — ONDANSETRON HCL 4 MG/2ML IJ SOLN
4.0000 mg | Freq: Once | INTRAMUSCULAR | Status: AC
  Administered 2023-09-24: 4 mg via INTRAVENOUS
  Filled 2023-09-24: qty 2

## 2023-09-24 MED ORDER — VANCOMYCIN HCL IN DEXTROSE 1-5 GM/200ML-% IV SOLN
1000.0000 mg | Freq: Once | INTRAVENOUS | Status: AC
  Administered 2023-09-24: 1000 mg via INTRAVENOUS
  Filled 2023-09-24: qty 200

## 2023-09-24 MED ORDER — KETOROLAC TROMETHAMINE 30 MG/ML IJ SOLN
30.0000 mg | Freq: Four times a day (QID) | INTRAMUSCULAR | Status: DC | PRN
  Administered 2023-09-24 – 2023-09-26 (×7): 30 mg via INTRAVENOUS
  Filled 2023-09-24 (×7): qty 1

## 2023-09-24 MED ORDER — SODIUM CHLORIDE 0.9 % IV SOLN
2.0000 g | Freq: Once | INTRAVENOUS | Status: AC
  Administered 2023-09-24: 2 g via INTRAVENOUS
  Filled 2023-09-24: qty 12.5

## 2023-09-24 MED ORDER — KETOROLAC TROMETHAMINE 15 MG/ML IJ SOLN
15.0000 mg | Freq: Once | INTRAMUSCULAR | Status: AC
  Administered 2023-09-24: 15 mg via INTRAVENOUS
  Filled 2023-09-24: qty 1

## 2023-09-24 MED ORDER — METRONIDAZOLE 500 MG/100ML IV SOLN
500.0000 mg | Freq: Once | INTRAVENOUS | Status: AC
  Administered 2023-09-24: 500 mg via INTRAVENOUS
  Filled 2023-09-24: qty 100

## 2023-09-24 MED ORDER — HYDROMORPHONE HCL 1 MG/ML IJ SOLN
0.5000 mg | Freq: Once | INTRAMUSCULAR | Status: AC
  Administered 2023-09-24: 0.5 mg via INTRAVENOUS
  Filled 2023-09-24: qty 0.5

## 2023-09-24 MED ORDER — SODIUM CHLORIDE 0.9 % IV BOLUS
1000.0000 mL | Freq: Once | INTRAVENOUS | Status: AC
  Administered 2023-09-24: 1000 mL via INTRAVENOUS

## 2023-09-24 MED ORDER — OXYCODONE HCL 5 MG PO TABS
5.0000 mg | ORAL_TABLET | ORAL | Status: DC | PRN
  Administered 2023-09-24 – 2023-09-26 (×8): 5 mg via ORAL
  Filled 2023-09-24 (×8): qty 1

## 2023-09-24 MED ORDER — METOPROLOL TARTRATE 5 MG/5ML IV SOLN: 5.0000 mg | Freq: Four times a day (QID) | INTRAVENOUS | Status: DC | PRN

## 2023-09-24 MED ORDER — DOXYCYCLINE HYCLATE 100 MG PO TABS
100.0000 mg | ORAL_TABLET | Freq: Once | ORAL | Status: AC
  Administered 2023-09-24: 100 mg via ORAL
  Filled 2023-09-24: qty 1

## 2023-09-24 MED ORDER — FENTANYL CITRATE PF 50 MCG/ML IJ SOSY
50.0000 ug | PREFILLED_SYRINGE | Freq: Once | INTRAMUSCULAR | Status: AC
  Administered 2023-09-24: 50 ug via INTRAMUSCULAR
  Filled 2023-09-24: qty 1

## 2023-09-24 MED ORDER — TRAZODONE HCL 50 MG PO TABS: 25.0000 mg | ORAL_TABLET | Freq: Every evening | ORAL | Status: DC | PRN

## 2023-09-24 MED ORDER — FOLIC ACID 1 MG PO TABS
1.0000 mg | ORAL_TABLET | Freq: Every day | ORAL | Status: DC
  Administered 2023-09-24 – 2023-09-26 (×3): 1 mg via ORAL
  Filled 2023-09-24 (×3): qty 1

## 2023-09-24 MED ORDER — ENOXAPARIN SODIUM 40 MG/0.4ML IJ SOSY
40.0000 mg | PREFILLED_SYRINGE | INTRAMUSCULAR | Status: DC
  Filled 2023-09-24 (×2): qty 0.4

## 2023-09-24 MED ORDER — LACTATED RINGERS IV SOLN: INTRAVENOUS | Status: DC

## 2023-09-24 MED ORDER — PANTOPRAZOLE SODIUM 40 MG PO TBEC
80.0000 mg | DELAYED_RELEASE_TABLET | Freq: Every day | ORAL | Status: DC
Start: 2023-09-24 — End: 2023-09-26
  Administered 2023-09-24 – 2023-09-26 (×3): 80 mg via ORAL
  Filled 2023-09-24 (×3): qty 2

## 2023-09-24 MED ORDER — POLYETHYLENE GLYCOL 3350 17 G PO PACK
17.0000 g | PACK | Freq: Every day | ORAL | Status: DC | PRN
  Administered 2023-09-25: 17 g via ORAL
  Filled 2023-09-24: qty 1

## 2023-09-24 MED ORDER — TAMSULOSIN HCL 0.4 MG PO CAPS
0.4000 mg | ORAL_CAPSULE | Freq: Every day | ORAL | Status: DC
  Administered 2023-09-24 – 2023-09-25 (×2): 0.4 mg via ORAL
  Filled 2023-09-24 (×2): qty 1

## 2023-09-24 MED ORDER — NICOTINE 14 MG/24HR TD PT24
14.0000 mg | MEDICATED_PATCH | Freq: Every day | TRANSDERMAL | Status: DC
  Administered 2023-09-24 – 2023-09-26 (×3): 14 mg via TRANSDERMAL
  Filled 2023-09-24 (×3): qty 1

## 2023-09-24 MED ORDER — THIAMINE MONONITRATE 100 MG PO TABS
100.0000 mg | ORAL_TABLET | Freq: Every day | ORAL | Status: DC
  Administered 2023-09-24 – 2023-09-26 (×3): 100 mg via ORAL
  Filled 2023-09-24 (×3): qty 1

## 2023-09-24 NOTE — Hospital Course (Signed)
 Patient is a 58 year old male with history of HTN, HLD, GERD, smoker who presents with 4-day history of bilateral lower extremity pain.  He reports pain from his back to his knees on the and then from his ankles to his feet.  He has been unable to sleep for because of this pain for the last 4 days and went to the urgent care on yesterday.  He had a fairly extensive workup there that was negative except for an elevated C-reactive protein.  He was given a shot of steroids and a prescription for hydrocodone .  He has been taking Tylenol  extensively which has not really helped his pain.  On this morning he woke up with a significant rash and was told to come to the ED.  In the ED he was noted to be febrile to 102.1 with an extensive maculopapular rash.  He denies back pain, cough, URI symptoms, dysuria, tick bite, any other lesion or pain.  He denies any itchiness of his rash.  He was noted to have a normal lactic acid normal WBC, normal sed rate, mildly elevated CK at 48, and normal CMP with exception of a T. bili of 1.3.  Chest x-ray and LS-spine were negative.

## 2023-09-24 NOTE — Assessment & Plan Note (Signed)
 Offered and excepted nicotine  patch

## 2023-09-24 NOTE — Assessment & Plan Note (Signed)
 Currently on no meds and normotensive Heart healthy diet

## 2023-09-24 NOTE — Consult Note (Signed)
 CODE SEPSIS - PHARMACY COMMUNICATION  **Broad Spectrum Antibiotics should be administered within 1 hour of Sepsis diagnosis**  Time Code Sepsis Called/Page Received: 1353  Antibiotics Ordered: vancomycin , cefepime , flagyl , doxycycline   Time of 1st antibiotic administration: 1411  Additional action taken by pharmacy: n/Tyler  If necessary, Name of Provider/Nurse Contacted: n/Tyler    Tyler Pollard Tyler Pollard ,PharmD Clinical Pharmacist  09/24/2023  2:07 PM

## 2023-09-24 NOTE — ED Triage Notes (Signed)
 Pt c/o bilateral thigh and feet pain. Pt was seen at [facility]- blood and xrays done- and sent due to excessive tylenol  intake and full body rash. Pt states rash is not itchy. Pt states he took norco and tylenol  due to the pain being too much to tolerate. Pt CAOX4, NAD noted. CMS intact, pt denies numbness.

## 2023-09-24 NOTE — Telephone Encounter (Addendum)
 Patient seen yesterday for leg pain.  Was unable to have x-ray performed yesterday due to family emergency.  Patient returns to clinic for l-spine xray that was offered yesterday.  X-ray performed is normal.  Patient noted to have an erythematous fine maculopapular rash on body by radiology tech and this was brought to my attention.  I quickly evaluated this patient.  He has fine erythematous maculopapular rash on face, neck, abdomen, back, thighs and upper arms.  No swelling and no difficulty breathing.  Patient reports that he did take the Norco I prescribed for him for pain relief every 6 hours as prescribed and has also been taking 4 tablets of Tylenol  every 6 hours for pain relief.  He had told me yesterday that he took 20 tablets of Tylenol  the previous day.  I discussed with patient that he should be seen in the emergency department at this time.  I am concerned about his level of pain.  He states that he almost called 911 last night due to the severe pain in his legs and feet.  I explained that I am also concerned about potential Tylenol  toxicity as he is taking way too much Tylenol  and I cannot check his Tylenol  levels in the urgent care.  His liver enzymes were within normal limits yesterday.  He was found to have an elevated CRP of over 13 so explained there is evidence of inflammation or infection. Patient needs higher level of care and more comprehensive work up. May need tickborne illness labs among other workup. Patient is agreeable and will take himself to [facility] at this time. He is leaving in stable conditions.

## 2023-09-24 NOTE — Assessment & Plan Note (Signed)
 Of unknown origin Curbside infectious disease especially given his extensive rash. Treat for presumed RMSF Doxycycline  Titers pending Full respiratory panel Parvo Rule out Lyme disease

## 2023-09-24 NOTE — H&P (Signed)
 History and Physical    Patient: Tyler Pollard FMW:[id number] DOB: [birth year] DOA: 09/24/2023 DOS: the patient was seen and examined on 09/24/2023 PCP: Pcp, No  Patient coming from: Home  Chief Complaint:  Chief Complaint  Patient presents with   Leg Pain   Rash   HPI: Tyler Pollard is a 58 y.o. male with medical history significant of HTN, HLD, GERD, smoker who presents with 4-day history of bilateral lower extremity pain.  He reports pain from his back to his knees on the and then from his ankles to his feet.  He has been unable to sleep for because of this pain for the last 4 days and went to the urgent care on yesterday.  He had a fairly extensive workup there that was negative except for an elevated C-reactive protein.  He was given a shot of steroids and a prescription for hydrocodone .  He has been taking Tylenol  extensively which has not really helped his pain.  On this morning he woke up with a significant rash and was told to come to the ED.  In the ED he was noted to be febrile to 102.1 with an extensive maculopapular rash.  He denies back pain, cough, URI symptoms, dysuria, tick bite, any other lesion or pain.  He denies any itchiness of his rash.  He was noted to have a normal lactic acid normal WBC, normal sed rate, mildly elevated CK at 48, and normal CMP with exception of a T. bili of 1.3.  Chest x-ray and LS-spine were negative. Review of Systems: As mentioned in the history of present illness. All other systems reviewed and are negative. History reviewed. No pertinent past medical history. History reviewed. No pertinent surgical history. Social History:  reports that he has been smoking cigarettes. He has a 10 pack-year smoking history. He has been exposed to tobacco smoke. He has never used smokeless tobacco. He reports current alcohol use. He reports that he does not use drugs.  No Known Allergies  History reviewed. No pertinent family history.  Prior to Admission medications    Medication Sig Start Date End Date Taking? Authorizing Provider  HYDROcodone -acetaminophen  (NORCO/VICODIN) 5-325 MG tablet Take 1 tablet by mouth every 6 (six) hours as needed for up to 3 days for severe pain (pain score 7-10). 09/23/23 09/26/23 Yes Arvis Jolan NOVAK, PA-C  omeprazole (PRILOSEC) 40 MG capsule  07/21/17   [provider]  predniSONE  (DELTASONE ) 10 MG tablet Take 6 tabs po on day 1 and decrease by 1 tab daily until complete 09/23/23   Arvis Jolan NOVAK, PA-C  tadalafil  (CIALIS ) 10 MG tablet Take 1 tablet (10 mg total) by mouth daily. OK to take additional 10mg  1 hour prior to sexual activity 09/20/23   Francisca Redell BROCKS, MD  tamsulosin  (FLOMAX ) 0.4 MG CAPS capsule Take 1 capsule (0.4 mg total) by mouth daily after supper. [email or web address] 09/20/23   Francisca Redell BROCKS, MD    Physical Exam: Vitals:   09/24/23 1159 09/24/23 1500 09/24/23 1500 09/24/23 1502  BP:      Pulse:    100  Resp:    20  Temp:  (!) 102 F (38.9 C) (!) 102.1 F (38.9 C)   TempSrc:   Oral   SpO2: 95%     Weight:       Physical Examination: General appearance - alert, well appearing, and in no distress Chest - clear to auscultation, no wheezes, rales or rhonchi, symmetric air entry Heart - normal rate, regular  rhythm, normal S1, S2, no murmurs, rubs, clicks or gallops Abdomen - soft, nontender, nondistended, no masses or organomegaly Extremities - peripheral pulses normal, no pedal edema, no clubbing or cyanosis Skin -diffuse maculopapular rash involving the chest and back as well as arms and legs.  See photo    Photos    09/24/2023 15:51     Photos    09/24/2023 15:50    Data Reviewed: Results for orders placed or performed during the hospital encounter of 09/24/23 (from the past 24 hours)  Acetaminophen  level     Status: None   Collection Time: 09/24/23 11:00 AM  Result Value Ref Range   Acetaminophen  (Tylenol ), Serum 16 10 - 30 ug/mL  CBC with Differential      Status: None   Collection Time: 09/24/23 12:13 PM  Result Value Ref Range   WBC 8.1 4.0 - 10.5 K/uL   RBC 4.82 4.22 - 5.81 MIL/uL   Hemoglobin 15.6 13.0 - 17.0 g/dL   HCT 55.3 60.9 - 47.9 %   MCV 92.5 80.0 - 100.0 fL   MCH 32.4 26.0 - 34.0 pg   MCHC 35.0 30.0 - 36.0 g/dL   RDW 86.8 88.4 - 84.4 %   Platelets 172 150 - 400 K/uL   nRBC 0.0 0.0 - 0.2 %   Neutrophils Relative % 79 %   Neutro Abs 6.4 1.7 - 7.7 K/uL   Lymphocytes Relative 9 %   Lymphs Abs 0.7 0.7 - 4.0 K/uL   Monocytes Relative 9 %   Monocytes Absolute 0.8 0.1 - 1.0 K/uL   Eosinophils Relative 2 %   Eosinophils Absolute 0.2 0.0 - 0.5 K/uL   Basophils Relative 1 %   Basophils Absolute 0.0 0.0 - 0.1 K/uL   Immature Granulocytes 0 %   Abs Immature Granulocytes 0.03 0.00 - 0.07 K/uL  Comprehensive metabolic panel     Status: Abnormal   Collection Time: 09/24/23 12:13 PM  Result Value Ref Range   Sodium 135 135 - 145 mmol/L   Potassium 4.4 3.5 - 5.1 mmol/L   Chloride 99 98 - 111 mmol/L   CO2 25 22 - 32 mmol/L   Glucose, Bld 105 (H) 70 - 99 mg/dL   BUN 10 6 - 20 mg/dL   Creatinine, Ser 9.02 0.61 - 1.24 mg/dL   Calcium 9.1 8.9 - 89.6 mg/dL   Total Protein 7.2 6.5 - 8.1 g/dL   Albumin 4.2 3.5 - 5.0 g/dL   AST 26 15 - 41 U/L   ALT 33 0 - 44 U/L   Alkaline Phosphatase 57 38 - 126 U/L   Total Bilirubin 1.3 (H) 0.0 - 1.2 mg/dL   GFR, Estimated >39 >39 mL/min   Anion gap 11 5 - 15  Sedimentation rate     Status: None   Collection Time: 09/24/23 12:13 PM  Result Value Ref Range   Sed Rate 20 0 - 20 mm/hr  CK     Status: Abnormal   Collection Time: 09/24/23 12:13 PM  Result Value Ref Range   Total CK 48 (L) 49 - 397 U/L  Lactic acid, plasma     Status: None   Collection Time: 09/24/23  1:57 PM  Result Value Ref Range   Lactic Acid, Venous 1.2 0.5 - 1.9 mmol/L  Mononucleosis screen     Status: None   Collection Time: 09/24/23  1:57 PM  Result Value Ref Range   Mono Screen NEGATIVE NEGATIVE  Resp panel by RT-PCR  (RSV,  Flu A&B, Covid) Anterior Nasal Swab     Status: None   Collection Time: 09/24/23  1:57 PM   Specimen: Anterior Nasal Swab  Result Value Ref Range   SARS Coronavirus 2 by RT PCR NEGATIVE NEGATIVE   Influenza A by PCR NEGATIVE NEGATIVE   Influenza B by PCR NEGATIVE NEGATIVE   Resp Syncytial Virus by PCR NEGATIVE NEGATIVE  Rapid HIV screen (HIV 1/2 Ab+Ag)     Status: None   Collection Time: 09/24/23  1:57 PM  Result Value Ref Range   HIV-1 P24 Antigen - HIV24 NON REACTIVE NON REACTIVE   HIV 1/2 Antibodies NON REACTIVE NON REACTIVE   Interpretation (HIV Ag Ab)      A non reactive test result means that HIV 1 or HIV 2 antibodies and HIV 1 p24 antigen were not detected in the specimen.   DG Chest Portable 1 View Result Date: 09/24/2023 CLINICAL DATA:  Fever, bilateral leg pain EXAM: PORTABLE CHEST 1 VIEW COMPARISON:  None Available. FINDINGS: Single frontal view of the chest demonstrates an unremarkable cardiac silhouette. No acute airspace disease, effusion, or pneumothorax. No acute bony abnormalities. IMPRESSION: 1. No acute intrathoracic process. Electronically Signed   By: Ozell Daring M.D.   On: 09/24/2023 15:10   DG Lumbar Spine Complete Result Date: 09/24/2023 EXAM: 4 VIEW(S) XRAY OF THE LUMBAR SPINE 09/24/2023 09:20:00 AM COMPARISON: None available. CLINICAL HISTORY: Low back pain x 1 yr. Patient c/o bilateral leg pain and achy feet that started 3 days ago. Patient denies injury or fall. Patient has been taking Tylenol  for pain. FINDINGS: LUMBAR SPINE: BONES: No acute fracture. No aggressive appearing osseous lesion. Alignment is normal. Small anterior endplate spurs L3 -L5. DISCS AND DEGENERATIVE CHANGES: No severe degenerative changes. SOFT TISSUES: No acute abnormality. IMPRESSION: 1. No acute abnormality of the lumbar spine related to the clinical history of low back pain and bilateral leg pain. Electronically signed by: Katheleen Faes MD 09/24/2023 09:37 AM EDT RP Workstation:  [id number]     Assessment and Plan: * Fever Of unknown origin Curbside infectious disease especially given his extensive rash. Treat for presumed RMSF Doxycycline  Titers pending Full respiratory panel Parvo Rule out Lyme disease   Tobacco use Offered and excepted nicotine  patch  Rash Unclear etiology, it does blanch Check RPR  Hyperlipidemia Currently on no meds for this Heart healthy diet  Gastroesophageal reflux disease Continue PPI  Essential hypertension Currently on no meds and normotensive Heart healthy diet      Advance Care Planning:   Code Status: Not on file full  Consults: Infectious disease via phone  Family Communication: Wife at bedside  Severity of Illness: The appropriate patient status for this patient is INPATIENT. Inpatient status is judged to be reasonable and necessary in order to provide the required intensity of service to ensure the patient's safety. The patient's presenting symptoms, physical exam findings, and initial radiographic and laboratory data in the context of their chronic comorbidities is felt to place them at high risk for further clinical deterioration. Furthermore, it is not anticipated that the patient will be medically stable for discharge from the hospital within 2 midnights of admission.   * I certify that at the point of admission it is my clinical judgment that the patient will require inpatient hospital care spanning beyond 2 midnights from the point of admission due to high intensity of service, high risk for further deterioration and high frequency of surveillance required.*  Author: Glenys GORMAN Birk, MD 09/24/2023 4:07  PM  For on call review [email or web address].

## 2023-09-24 NOTE — ED Provider Notes (Signed)
 [facility] Provider Note    Event Date/Time   First MD Initiated Contact with Patient 09/24/23 1202     (approximate)   History   Leg Pain and Rash   HPI  Tyler Pollard is a 58 y.o. male history of hypertension, GERD, hyperlipidemia, nondependent EtOH abuse, psoriasis presents emergency department with bilateral thigh and foot pain.  Seen at urgent care yesterday.  Was told it was lumbar radiculopathy.  They also did labs on the patient.  Patient has been taking quite a bit of Tylenol .  States he took 57 Tylenol  pills on Tuesday, 12 on Wednesday, 12 on Thursday, on Friday he also took Norco which had Tylenol  in it +8 Tylenol , then Saturday which is today he took 1 Norco with Tylenol  and 4 extra Tylenol  at 8 AM this morning.  Patient went back to urgent care.  They sent him here as he now has a rash and still having a lot of pain.  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  States he does not know most of the people that work there and does not know if they have been sick.  Denies tick bite.  No new exposures.      Physical Exam   Triage Vital Signs: ED Triage Vitals  Encounter Vitals Group     BP 09/24/23 1107 125/82     Girls Systolic BP Percentile --      Girls Diastolic BP Percentile --      Boys Systolic BP Percentile --      Boys Diastolic BP Percentile --      Pulse Rate 09/24/23 1107 (!) 106     Resp 09/24/23 1107 16     Temp 09/24/23 1107 99.7 F (37.6 C)     Temp Source 09/24/23 1107 Oral     SpO2 09/24/23 1107 95 %     Weight 09/24/23 1059 200 lb (90.7 kg)     Height --      Head Circumference --      Peak Flow --      Pain Score 09/24/23 1058 10     Pain Loc --      Pain Education --      Exclude from Growth Chart --     Most recent vital signs: Vitals:   09/24/23 1500 09/24/23 1502  BP:    Pulse:  100  Resp:  20  Temp: (!)  102.1 F (38.9 C)   SpO2:       General: Awake, no distress.   CV:  Good peripheral perfusion.  Resp:  Normal effort. Abd:  No distention.   Other:  Throat appears to be normal, mouth is normal with no unusual spots or lesions noted, neck is supple, no lymphadenopathy, no rash on his palms or soles of his feet, skin has a diffuse reddish rash noted from the trunk onto the arms and lower extremities, does not extend into the feet or hands.  Patient states the rash is not itchy.   ED Results / Procedures / Treatments   Labs (all labs ordered are listed, but only abnormal results are displayed) Labs Reviewed  COMPREHENSIVE METABOLIC PANEL WITH GFR - Abnormal; Notable for the following components:      Result Value   Glucose, Bld 105 (*)    Total Bilirubin 1.3 (*)    All other components  within normal limits  CK - Abnormal; Notable for the following components:   Total CK 48 (*)    All other components within normal limits  RESP PANEL BY RT-PCR (RSV, FLU A&B, COVID)  [id number]  CULTURE, BLOOD (ROUTINE X 2)  CULTURE, BLOOD (ROUTINE X 2)  ACETAMINOPHEN  LEVEL  CBC WITH DIFFERENTIAL/PLATELET  SEDIMENTATION RATE  LACTIC ACID, PLASMA  MONONUCLEOSIS SCREEN  RAPID HIV SCREEN (HIV 1/2 AB+AG)  LACTIC ACID, PLASMA  ROCKY MTN SPOTTED FVR ABS PNL(IGG+IGM)  LYME DISEASE SEROLOGY W/REFLEX  RPR     EKG     RADIOLOGY Chest x-ray    PROCEDURES:   .Critical Care  Performed by: Gasper Devere ORN, PA-C Authorized by: Gasper Devere ORN, PA-C   Critical care provider statement:    Critical care time (minutes):  30   Critical care time was exclusive of:  Separately billable procedures and treating other patients   Critical care was necessary to treat or prevent imminent or life-threatening deterioration of the following conditions:  Sepsis   Critical care was time spent personally by me on the following activities:  Development of treatment plan with patient or surrogate, discussions with  consultants, evaluation of patient's response to treatment, examination of patient, ordering and review of laboratory studies, ordering and review of radiographic studies, ordering and performing treatments and interventions, pulse oximetry, re-evaluation of patient's condition and review of old charts   I assumed direction of critical care for this patient from another provider in my specialty: yes   Comments:     Advised by Dr. Ernest to start sepsis protocols,   Critical Care: Yes Chief Complaint  Patient presents with   Leg Pain   Rash      MEDICATIONS ORDERED IN ED: Medications  metroNIDAZOLE  (FLAGYL ) IVPB 500 mg (500 mg Intravenous New Bag/Given 09/24/23 1419)  vancomycin  (VANCOCIN ) IVPB 1000 mg/200 mL premix (1,000 mg Intravenous New Bag/Given 09/24/23 1458)  ketorolac  (TORADOL ) 15 MG/ML injection 15 mg (has no administration in time range)  fentaNYL  (SUBLIMAZE ) injection 50 mcg (50 mcg Intramuscular Given 09/24/23 1221)  HYDROmorphone  (DILAUDID ) injection 0.5 mg (0.5 mg Intravenous Given 09/24/23 1415)  ondansetron  (ZOFRAN ) injection 4 mg (4 mg Intravenous Given 09/24/23 1414)  sodium chloride  0.9 % bolus 1,000 mL (1,000 mLs Intravenous New Bag/Given 09/24/23 1403)  ceFEPIme  (MAXIPIME ) 2 g in sodium chloride  0.9 % 100 mL IVPB (0 g Intravenous Stopped 09/24/23 1447)  doxycycline  (VIBRA -TABS) tablet 100 mg (100 mg Oral Given 09/24/23 1411)     IMPRESSION / MDM / ASSESSMENT AND PLAN / ED COURSE  I reviewed the triage vital signs and the nursing notes.                              Differential diagnosis includes, but is not limited to, sepsis, [facility] spotted fever, Lyme's, staph infection, measles, mono, COVID, influenza, RSV, Tylenol  toxicity, liver failure  Patient's presentation is most consistent with acute presentation with potential threat to life or bodily function.    Medications given: Fentanyl  50 mcg IM  Did review patient's past medical chart and labs from urgent  care from yesterday.  Patient's labs are mainly reassuring yesterday except for his CRP is elevated at 13.6  Repeated some labs today.  CBC and metabolic panel reassuring, normal LFTs.  Sed rate reassuring, CK due to the amount of pain patient is having was normal.  Acetaminophen  level was normal  Spoke with poison control.  Due to the patient's last dose of Tylenol  being around 8 AM this morning with a normal acetaminophen  level 3 hours later and normal LFTs they do not feel that we need to worry about liver toxicity at this time.  No treatment is necessary for Tylenol  overuse.  Did speak with Dr. Ernest, she did  evaluate the patient.  At this time patient has now started to run a fever of 102.6.  Will start sepsis workup.  Additional labs ordered.  Fluids initiated  Patient was given IV cefepime , 2 g, Doxy 100  mg p.o., Dilaudid  0.5 mg IV, Flagyl  500 mL IV and vancomycin  1000 mg IV   Patient continues to have pain.  Respiratory panel reassuring, HIV panel reassuring, MRI reassuring.  Care is being transferred to Dr. Ernest.  She is to discuss with hospitalist.   FINAL CLINICAL IMPRESSION(S) / ED DIAGNOSES   Final diagnoses:  Rash  Fever and chills  Pain in both lower extremities  Acute sepsis (HCC)     Rx / DC Orders   ED Discharge Orders     None        Note:  This document was prepared using Dragon voice recognition software and may include unintentional dictation errors.    Gasper Devere ORN, PA-C 09/24/23 1504    Ernest Ronal BRAVO, MD 09/25/23 1323

## 2023-09-24 NOTE — Assessment & Plan Note (Signed)
 Unclear etiology, it does blanch Check RPR

## 2023-09-24 NOTE — Assessment & Plan Note (Signed)
 Currently on no meds for this Heart healthy diet

## 2023-09-24 NOTE — Progress Notes (Signed)
 Ellnk following for sepsis protocol.

## 2023-09-24 NOTE — Assessment & Plan Note (Signed)
 Continue PPI.

## 2023-09-25 DIAGNOSIS — R509 Fever, unspecified: Secondary | ICD-10-CM

## 2023-09-25 LAB — COMPREHENSIVE METABOLIC PANEL WITH GFR
ALT: 15 U/L (ref 0–44)
AST: 14 U/L — ABNORMAL LOW (ref 15–41)
Albumin: 1.9 g/dL — ABNORMAL LOW (ref 3.5–5.0)
Alkaline Phosphatase: 27 U/L — ABNORMAL LOW (ref 38–126)
Anion gap: 10 (ref 5–15)
BUN: 9 mg/dL (ref 6–20)
CO2: 19 mmol/L — ABNORMAL LOW (ref 22–32)
Calcium: 7.3 mg/dL — ABNORMAL LOW (ref 8.9–10.3)
Chloride: 104 mmol/L (ref 98–111)
Creatinine, Ser: 0.53 mg/dL — ABNORMAL LOW (ref 0.61–1.24)
GFR, Estimated: >60 mL/min (ref >60)
Glucose, Bld: 84 mg/dL (ref 70–99)
Potassium: 3.8 mmol/L (ref 3.5–5.1)
Sodium: 133 mmol/L — ABNORMAL LOW (ref 135–145)
Total Bilirubin: 0.7 mg/dL (ref 0.0–1.2)
Total Protein: 3.7 g/dL — ABNORMAL LOW (ref 6.5–8.1)

## 2023-09-25 LAB — CBC
HCT: 36.5 % — ABNORMAL LOW (ref 39.0–52.0)
Hemoglobin: 12.7 g/dL — ABNORMAL LOW (ref 13.0–17.0)
MCH: 32.2 pg (ref 26.0–34.0)
MCHC: 34.8 g/dL (ref 30.0–36.0)
MCV: 92.6 fL (ref 80.0–100.0)
Platelets: 151 K/uL (ref 150–400)
RBC: 3.94 MIL/uL — ABNORMAL LOW (ref 4.22–5.81)
RDW: 13 % (ref 11.5–15.5)
WBC: 4.9 K/uL (ref 4.0–10.5)
nRBC: 0 % (ref 0.0–0.2)

## 2023-09-25 LAB — RPR: RPR Ser Ql: NONREACTIVE

## 2023-09-25 NOTE — Progress Notes (Addendum)
 Progress Note    Tyler Pollard  FMW:[id number] DOB: 1965/02/14  DOA: 09/24/2023 PCP: Pcp, No      Brief Narrative:    Medical records reviewed and are as summarized below:  Tyler Pollard is a 58 y.o. male with medical history significant for hypertension, hyperlipidemia, GERD, tobacco use disorder, who presented to the hospital with 4-day history of bilateral lower extremity pain involving the thighs and legs.  He also complained of low back pain.  Symptoms are progressively worsened and he had difficulty sleeping so he went to the urgent care clinic the day prior to admission. Workup at the urgent care clinic revealed elevated CRP.  He was given a shot of steroids and was given a prescription for hydrocodone .  He had also been taking significant amount of Tylenol  but this has not helped with his pain.  He noticed a rash on his chest, back, upper and lower extremities on the morning of admission.  In the ED, he was febrile with a temperature of 102.1 F and was noticed to have diffuse erythematous maculopapular rash on the torso and upper and lower extremities.   He was started on empiric IV antibiotics.   Assessment/Plan:   Principal Problem:   Fever Active Problems:   Essential hypertension   Gastroesophageal reflux disease   Hyperlipidemia   Rash   Tobacco use    Body mass index is 26.21 kg/m.   Fever, rash: Etiology unclear.  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  Serology for Parvovirus B19, RPR, RMSF and Lyme disease are pending. Follow-up blood cultures.  Case discussed with Dr. Juli, ID specialist, via secure chat.   Low back pain and lower extremity pain: Improved.   Mild hyponatremia: Asymptomatic.  Monitor BMP.   Comorbidities include hypertension, hyperlipidemia, tobacco use disorder     Diet Order             Diet Heart Room service appropriate? Yes; Fluid consistency: Thin  Diet effective now                                   Consultants: None  Procedures: None    Medications:    enoxaparin  (LOVENOX ) injection  40 mg Subcutaneous Q24H   folic acid   1 mg Oral Daily   nicotine   14 mg Transdermal Daily   pantoprazole   80 mg Oral Daily   tamsulosin   0.4 mg Oral QPC supper   thiamine   100 mg Oral Daily   Continuous Infusions:  doxycycline  (VIBRAMYCIN ) IV Stopped (09/25/23 0313)   lactated ringers  75 mL/hr at 09/25/23 0854     Anti-infectives (From admission, onward)    Start     Dose/Rate Route Frequency Ordered Stop   09/25/23 0200  doxycycline  (VIBRAMYCIN ) 100 mg in sodium chloride  0.9 % 250 mL IVPB        100 mg 125 mL/hr over 120 Minutes Intravenous Every 12 hours 09/24/23 1641     09/24/23 1400  ceFEPIme  (MAXIPIME ) 2 g in sodium chloride  0.9 % 100 mL IVPB        2 g 200 mL/hr over 30 Minutes Intravenous  Once 09/24/23 1353 09/24/23 1447   09/24/23 1400  metroNIDAZOLE  (FLAGYL ) IVPB 500 mg        500 mg 100 mL/hr over 60 Minutes Intravenous  Once 09/24/23 1353 09/24/23 1604   09/24/23 1400  vancomycin  (VANCOCIN ) IVPB 1000 mg/200 mL  premix        1,000 mg 200 mL/hr over 60 Minutes Intravenous  Once 09/24/23 1353 09/24/23 1606   09/24/23 1400  doxycycline  (VIBRA -TABS) tablet 100 mg        100 mg Oral  Once 09/24/23 1353 09/24/23 1411              Family Communication/Anticipated D/C date and plan/Code Status   DVT prophylaxis: enoxaparin  (LOVENOX ) injection 40 mg Start: 09/24/23 2200     Code Status: Full Code  Family Communication: None Disposition Plan: Plan to discharge home   Status is: Inpatient Remains inpatient appropriate because: Fever and rash       Subjective:   Interval events noted.  He feels better.  No fever or chills.  Severe pain in the thighs and legs have improved.  No headache, vomiting or diarrhea.  Objective:    Vitals:   09/24/23 1733 09/24/23 1928 09/24/23 1951 09/25/23 0336  BP: 139/84 124/77 124/77  135/80  Pulse: 82 94 94 82  Resp:  17 17 16   Temp: 98.4 F (36.9 C) 98.9 F (37.2 C) 98.9 F (37.2 C) 99.2 F (37.3 C)  TempSrc: Oral  Oral Oral  SpO2:  96%  92%  Weight:   90.1 kg   Height:   6' 1 (1.854 m)    No data found.   Intake/Output Summary (Last 24 hours) at 09/25/2023 1057 Last data filed at 09/25/2023 0400 Gross per 24 hour  Intake 2295.82 ml  Output --  Net 2295.82 ml   Filed Weights   09/24/23 1059 09/24/23 1951  Weight: 90.7 kg 90.1 kg    Exam:  GEN: NAD SKIN: Warm and dry.  Erythematous papular papular rash on the back, chest, abdomen, bilateral arms and forearms, bilateral thighs, legs and feet (dorsal aspect) EYES: No pallor or icterus ENT: MMM CV: RRR PULM: CTA B ABD: soft, ND, NT, +BS CNS: AAO x 3, non focal EXT: No edema or tenderness        Data Reviewed:   I have personally reviewed following labs and imaging studies:  Labs: Labs show the following:   Basic Metabolic Panel: Recent Labs  Lab 09/23/23 1510 09/24/23 1213 09/25/23 0543  NA 132* 135 133*  K 3.9 4.4 3.8  CL 99 99 104  CO2 24 25 19*  GLUCOSE 103* 105* 84  BUN 9 10 9   CREATININE 0.92 0.97 0.53*  CALCIUM 8.9 9.1 7.3*   GFR Estimated Creatinine Clearance: 113.7 mL/min (A) (by C-G formula based on SCr of 0.53 mg/dL (L)). Liver Function Tests: Recent Labs  Lab 09/23/23 1510 09/24/23 1213 09/25/23 0543  AST 31 26 14*  ALT 42 33 15  ALKPHOS 65 57 27*  BILITOT 1.2 1.3* 0.7  PROT 8.4* 7.2 3.7*  ALBUMIN 4.9 4.2 1.9*   No results for input(s): LIPASE, AMYLASE in the last 168 hours. No results for input(s): AMMONIA in the last 168 hours. Coagulation profile Recent Labs  Lab 09/24/23 1725  INR 1.2    CBC: Recent Labs  Lab 09/23/23 1510 09/24/23 1213 09/25/23 0543  WBC 8.2 8.1 4.9  NEUTROABS 6.5 6.4  --   HGB 16.5 15.6 12.7*  HCT 46.0 44.6 36.5*  MCV 89.8 92.5 92.6  PLT 180 172 151   Cardiac Enzymes: Recent Labs  Lab 09/23/23 1510  09/24/23 1213  CKTOTAL 43* 48*   BNP (last 3 results) No results for input(s): PROBNP in the last 8760 hours. CBG: No results for  input(s): GLUCAP in the last 168 hours. D-Dimer: No results for input(s): DDIMER in the last 72 hours. Hgb A1c: Recent Labs    09/24/23 1213  HGBA1C 5.2   Lipid Profile: No results for input(s): CHOL, HDL, LDLCALC, TRIG, CHOLHDL, LDLDIRECT in the last 72 hours. Thyroid function studies: Recent Labs    09/24/23 1357  TSH 1.045   Anemia work up: Recent Labs    09/23/23 1510  VITAMINB12 456   Sepsis Labs: Recent Labs  Lab 09/23/23 1510 09/24/23 1213 09/24/23 1357 09/25/23 0543  WBC 8.2 8.1  --  4.9  LATICACIDVEN  --   --  1.2  --     Microbiology Recent Results (from the past 240 hours)  Blood culture (routine x 2)     Status: None (Preliminary result)   Collection Time: 09/24/23 12:13 PM   Specimen: BLOOD  Result Value Ref Range Status   Specimen Description BLOOD BLOOD RIGHT ARM  Final   Special Requests   Final    BOTTLES DRAWN AEROBIC AND ANAEROBIC Blood Culture adequate volume   Culture   Final    NO GROWTH < 24 HOURS Performed at [facility], [address] [place], [place] [address]    Report Status PENDING  Incomplete  Blood culture (routine x 2)     Status: None (Preliminary result)   Collection Time: 09/24/23  1:57 PM   Specimen: BLOOD  Result Value Ref Range Status   Specimen Description BLOOD BLOOD RIGHT ARM  Final   Special Requests   Final    BOTTLES DRAWN AEROBIC AND ANAEROBIC Blood Culture adequate volume   Culture   Final    NO GROWTH < 24 HOURS Performed at [facility], [address] [place], [place] [address]    Report Status PENDING  Incomplete  Resp panel by RT-PCR (RSV, Flu A&B, Covid) Anterior Nasal Swab     Status: None   Collection Time: 09/24/23  1:57 PM   Specimen: Anterior Nasal Swab  Result Value Ref Range Status   SARS Coronavirus 2 by RT PCR  NEGATIVE NEGATIVE Final    Comment: (NOTE) SARS-CoV-2 target nucleic acids are NOT DETECTED.  The SARS-CoV-2 RNA is generally detectable in upper respiratory specimens during the acute phase of infection. The lowest concentration of SARS-CoV-2 viral copies this assay can detect is 138 copies/mL. A negative result does not preclude SARS-Cov-2 infection and should not be used as the sole basis for treatment or other patient management decisions. A negative result may occur with  improper specimen collection/handling, submission of specimen other than nasopharyngeal swab, presence of viral mutation(s) within the areas targeted by this assay, and inadequate number of viral copies(<138 copies/mL). A negative result must be combined with clinical observations, patient history, and epidemiological information. The expected result is Negative.  Fact Sheet for Patients:  BloggerCourse.com  Fact Sheet for Healthcare Providers:  SeriousBroker.it  This test is no t yet approved or cleared by the [place]  FDA and  has been authorized for detection and/or diagnosis of SARS-CoV-2 by FDA under an Emergency Use Authorization (EUA). This EUA will remain  in effect (meaning this test can be used) for the duration of the COVID-19 declaration under Section 564(b)(1) of the Act, 21 U.S.C.section 360bbb-3(b)(1), unless the authorization is terminated  or revoked sooner.       Influenza A by PCR NEGATIVE NEGATIVE Final   Influenza B by PCR NEGATIVE NEGATIVE Final    Comment: (NOTE) The Xpert  Xpress SARS-CoV-2/FLU/RSV plus assay is intended as an aid in the diagnosis of influenza from Nasopharyngeal swab specimens and should not be used as a sole basis for treatment. Nasal washings and aspirates are unacceptable for Xpert Xpress SARS-CoV-2/FLU/RSV testing.  Fact Sheet for Patients: BloggerCourse.com  Fact Sheet for  Healthcare Providers: SeriousBroker.it  This test is not yet approved or cleared by the [place]  FDA and has been authorized for detection and/or diagnosis of SARS-CoV-2 by FDA under an Emergency Use Authorization (EUA). This EUA will remain in effect (meaning this test can be used) for the duration of the COVID-19 declaration under Section 564(b)(1) of the Act, 21 U.S.C. section 360bbb-3(b)(1), unless the authorization is terminated or revoked.     Resp Syncytial Virus by PCR NEGATIVE NEGATIVE Final    Comment: (NOTE) Fact Sheet for Patients: BloggerCourse.com  Fact Sheet for Healthcare Providers: SeriousBroker.it  This test is not yet approved or cleared by the [place]  FDA and has been authorized for detection and/or diagnosis of SARS-CoV-2 by FDA under an Emergency Use Authorization (EUA). This EUA will remain in effect (meaning this test can be used) for the duration of the COVID-19 declaration under Section 564(b)(1) of the Act, 21 U.S.C. section 360bbb-3(b)(1), unless the authorization is terminated or revoked.  Performed at [facility], [address] [place], [place] [address]   Respiratory (~20 pathogens) panel by PCR     Status: None   Collection Time: 09/24/23  1:57 PM   Specimen: Nasopharyngeal Swab; Respiratory  Result Value Ref Range Status   Adenovirus NOT DETECTED NOT DETECTED Final   Coronavirus 229E NOT DETECTED NOT DETECTED Final    Comment: (NOTE) The Coronavirus on the Respiratory Panel, DOES NOT test for the novel  Coronavirus (2019 nCoV)    Coronavirus HKU1 NOT DETECTED NOT DETECTED Final   Coronavirus NL63 NOT DETECTED NOT DETECTED Final   Coronavirus OC43 NOT DETECTED NOT DETECTED Final   Metapneumovirus NOT DETECTED NOT DETECTED Final   Rhinovirus / Enterovirus NOT DETECTED NOT DETECTED Final   Influenza A NOT DETECTED NOT DETECTED Final   Influenza  B NOT DETECTED NOT DETECTED Final   Parainfluenza Virus 1 NOT DETECTED NOT DETECTED Final   Parainfluenza Virus 2 NOT DETECTED NOT DETECTED Final   Parainfluenza Virus 3 NOT DETECTED NOT DETECTED Final   Parainfluenza Virus 4 NOT DETECTED NOT DETECTED Final   Respiratory Syncytial Virus NOT DETECTED NOT DETECTED Final   Bordetella pertussis NOT DETECTED NOT DETECTED Final   Bordetella Parapertussis NOT DETECTED NOT DETECTED Final   Chlamydophila pneumoniae NOT DETECTED NOT DETECTED Final   Mycoplasma pneumoniae NOT DETECTED NOT DETECTED Final    Comment: Performed at [facility], [address], [place], [place] [address]    Procedures and diagnostic studies:  DG Chest Portable 1 View Result Date: 09/24/2023 CLINICAL DATA:  Fever, bilateral leg pain EXAM: PORTABLE CHEST 1 VIEW COMPARISON:  None Available. FINDINGS: Single frontal view of the chest demonstrates an unremarkable cardiac silhouette. No acute airspace disease, effusion, or pneumothorax. No acute bony abnormalities. IMPRESSION: 1. No acute intrathoracic process. Electronically Signed   By: Ozell Daring M.D.   On: 09/24/2023 15:10   DG Lumbar Spine Complete Result Date: 09/24/2023 EXAM: 4 VIEW(S) XRAY OF THE LUMBAR SPINE 09/24/2023 09:20:00 AM COMPARISON: None available. CLINICAL HISTORY: Low back pain x 1 yr. Patient c/o bilateral leg pain and achy feet that started 3 days ago. Patient denies injury or fall. Patient has been taking  Tylenol  for pain. FINDINGS: LUMBAR SPINE: BONES: No acute fracture. No aggressive appearing osseous lesion. Alignment is normal. Small anterior endplate spurs L3 -L5. DISCS AND DEGENERATIVE CHANGES: No severe degenerative changes. SOFT TISSUES: No acute abnormality. IMPRESSION: 1. No acute abnormality of the lumbar spine related to the clinical history of low back pain and bilateral leg pain. Electronically signed by: Dayne Hassell MD 09/24/2023 09:37 AM EDT RP Workstation: [id number]                LOS: 1 day   Tyler Pollard  [facility]   Pager on [email or web address]. If 7PM-7AM, please contact night-coverage at [email or web address]     09/25/2023, 10:57 AM

## 2023-09-25 NOTE — Progress Notes (Signed)
 Patient continues to C/O general body pain and discomfort. Rotated PRN Oxy 5 mg and Toradol  30 mg. Patient requested to notify Dr. for additional pain medications. Dr. Cleatus notified. No new orders at this time.

## 2023-09-25 NOTE — Plan of Care (Signed)
  Problem: Education: Goal: Knowledge of General Education information will improve Description: Including pain rating scale, medication(s)/side effects and non-pharmacologic comfort measures Outcome: Progressing   Problem: Clinical Measurements: Goal: Ability to maintain clinical measurements within normal limits will improve Outcome: Progressing   Problem: Coping: Goal: Level of anxiety will decrease Outcome: Progressing   Problem: Skin Integrity: Goal: Risk for impaired skin integrity will decrease Outcome: Progressing

## 2023-09-26 DIAGNOSIS — R509 Fever, unspecified: Secondary | ICD-10-CM | POA: Diagnosis not present

## 2023-09-26 LAB — COMPREHENSIVE METABOLIC PANEL WITH GFR
ALT: 22 U/L (ref 0–44)
AST: 26 U/L (ref 15–41)
Albumin: 2.7 g/dL — ABNORMAL LOW (ref 3.5–5.0)
Alkaline Phosphatase: 45 U/L (ref 38–126)
Anion gap: 8 (ref 5–15)
BUN: 12 mg/dL (ref 6–20)
CO2: 26 mmol/L (ref 22–32)
Calcium: 8.3 mg/dL — ABNORMAL LOW (ref 8.9–10.3)
Chloride: 106 mmol/L (ref 98–111)
Creatinine, Ser: 0.83 mg/dL (ref 0.61–1.24)
GFR, Estimated: >60 mL/min (ref >60)
Glucose, Bld: 142 mg/dL — ABNORMAL HIGH (ref 70–99)
Potassium: 3.7 mmol/L (ref 3.5–5.1)
Sodium: 140 mmol/L (ref 135–145)
Total Bilirubin: 0.7 mg/dL (ref 0.0–1.2)
Total Protein: 6.1 g/dL — ABNORMAL LOW (ref 6.5–8.1)

## 2023-09-26 LAB — URINALYSIS, W/ REFLEX TO CULTURE (INFECTION SUSPECTED)
Bacteria, UA: NONE SEEN
Bilirubin Urine: NEGATIVE
Glucose, UA: NEGATIVE mg/dL
Hgb urine dipstick: NEGATIVE
Ketones, ur: NEGATIVE mg/dL
Leukocytes,Ua: NEGATIVE
Nitrite: NEGATIVE
Protein, ur: NEGATIVE mg/dL
Specific Gravity, Urine: 1.009 (ref 1.005–1.030)
Squamous Epithelial / HPF: 0 /HPF (ref 0–5)
pH: 6 (ref 5.0–8.0)

## 2023-09-26 LAB — C-REACTIVE PROTEIN: CRP: 16.6 mg/dL — ABNORMAL HIGH (ref <1.0)

## 2023-09-26 LAB — SEDIMENTATION RATE: Sed Rate: 39 mm/h — ABNORMAL HIGH (ref 0–20)

## 2023-09-26 MED ORDER — DOXYCYCLINE HYCLATE 100 MG PO TABS: 100.0000 mg | ORAL_TABLET | Freq: Two times a day (BID) | ORAL | 0 refills | Status: AC

## 2023-09-26 NOTE — Discharge Summary (Signed)
 Physician Discharge Summary   Patient: Tyler Pollard MRN: [id number] DOB: Mar 06, 1965  Admit date:     09/24/2023  Discharge date: 09/26/23  Discharge Physician: AIDA CHO   PCP: Pcp, No   Recommendations at discharge:   Follow-up with PCP in 1 week  Discharge Diagnoses: Principal Problem:   Fever Active Problems:   Essential hypertension   Gastroesophageal reflux disease   Hyperlipidemia   Rash   Tobacco use  Resolved Problems:   * No resolved hospital problems. *  Hospital Course:  Kurt Hoffmeier is a 58 y.o. male with medical history significant for hypertension, hyperlipidemia, GERD, tobacco use disorder, who presented to the hospital with 4-day history of bilateral lower extremity pain involving the thighs and legs.  He also complained of low back pain.  Symptoms are progressively worsened and he had difficulty sleeping so he went to the urgent care clinic the day prior to admission. Workup at the urgent care clinic revealed elevated CRP.  He was given a shot of steroids and was given a prescription for hydrocodone .  He had also been taking significant amount of Tylenol  but this has not helped with his pain.  He noticed a rash on his chest, back, upper and lower extremities on the morning of admission.   In the ED, he was febrile with a temperature of 102.1 F and was noticed to have diffuse erythematous maculopapular rash on the torso and upper and lower extremities.     He was started on empiric IV antibiotics.    Assessment and Plan:   Fever, rash: Improved.  Her rash has improved.  No fever since admission.  RMSF is under differential diagnosis.  Patient will be discharged on doxycycline  for 5 more days for presumed RMSF. Serology for Parvovirus B19, RMSF and Lyme disease are pending. RPR was nonreactive No growth on blood cultures thus far    Low back pain and lower extremity pain: Improved.     Mild hyponatremia: Improved     Comorbidities include  hypertension, hyperlipidemia, tobacco use disorder    His condition has improved and he is deemed stable for discharge home today.  Discharge plan discussed with the patient and his wife at the bedside.   ADDENDUM   Lyme disease test was negative, Parvovirus B19 IgG was positive but IgM was negative.  Chart review showed that RMSF test had been ordered on 09/24/2023 at 1:53 PM, collected at 2:04 PM but was canceled on 09/26/2023 at 10:31 PM.  It is not clear how the test was canceled because prior to discharge, the test was pending.  I called the lab to investigate why the test was canceled.  I spoke with Larraine (from the lab) and she was able to reorder RMSF test as an add-on test.  I called the patient on 09/27/23 at 5:13 pm to discuss the test results.  Also explained that the RMSF test had to be reordered because somehow the test had been canceled.  He said he was not feeling very well and he thinks he is running a fever and he is still having some pain.  I told him that he should come back to the emergency department if he was not feeling well.  He requested excuse duty for work so that he could stay out of work tomorrow 09/28/2023.      Consultants: [facility]bs[facility] ID specialist, Dr. Juli and Dr. Fayette Procedures performed: None Disposition: Home Diet recommendation:  Discharge Diet Orders (From admission, onward)  Start     Ordered   09/26/23 0000  Diet - low sodium heart healthy        09/26/23 1619           Cardiac diet DISCHARGE MEDICATION: Allergies as of 09/26/2023   No Known Allergies      Medication List     STOP taking these medications    HYDROcodone -acetaminophen  5-325 MG tablet Commonly known as: NORCO/VICODIN   predniSONE  10 MG tablet Commonly known as: DELTASONE    tadalafil  10 MG tablet Commonly known as: CIALIS        TAKE these medications    doxycycline  100 MG tablet Commonly known as: VIBRA -TABS Take 1 tablet (100 mg total) by mouth  2 (two) times daily for 5 days.   omeprazole 40 MG capsule Commonly known as: PRILOSEC 40 mg daily.   tamsulosin  0.4 MG Caps capsule Commonly known as: FLOMAX  Take 1 capsule (0.4 mg total) by mouth daily after supper. [email or web address] What changed: when to take this        Discharge Exam: Filed Weights   09/24/23 1059 09/24/23 1951  Weight: 90.7 kg 90.1 kg   GEN: NAD SKIN: Short torso, upper and lower extremities has improved EYES: No pallor or icterus ENT: MMM, neck is supple CV: RRR PULM: CTA B ABD: soft, ND, NT, +BS CNS: AAO x 3, non focal EXT: No edema or tenderness   Condition at discharge: good  The results of significant diagnostics from this hospitalization (including imaging, microbiology, ancillary and laboratory) are listed below for reference.   Imaging Studies: DG Chest Portable 1 View Result Date: 09/24/2023 CLINICAL DATA:  Fever, bilateral leg pain EXAM: PORTABLE CHEST 1 VIEW COMPARISON:  None Available. FINDINGS: Single frontal view of the chest demonstrates an unremarkable cardiac silhouette. No acute airspace disease, effusion, or pneumothorax. No acute bony abnormalities. IMPRESSION: 1. No acute intrathoracic process. Electronically Signed   By: Ozell Daring M.D.   On: 09/24/2023 15:10   DG Lumbar Spine Complete Result Date: 09/24/2023 EXAM: 4 VIEW(S) XRAY OF THE LUMBAR SPINE 09/24/2023 09:20:00 AM COMPARISON: None available. CLINICAL HISTORY: Low back pain x 1 yr. Patient c/o bilateral leg pain and achy feet that started 3 days ago. Patient denies injury or fall. Patient has been taking Tylenol  for pain. FINDINGS: LUMBAR SPINE: BONES: No acute fracture. No aggressive appearing osseous lesion. Alignment is normal. Small anterior endplate spurs L3 -L5. DISCS AND DEGENERATIVE CHANGES: No severe degenerative changes. SOFT TISSUES: No acute abnormality. IMPRESSION: 1. No acute abnormality of the lumbar spine related to the  clinical history of low back pain and bilateral leg pain. Electronically signed by: Katheleen Faes MD 09/24/2023 09:37 AM EDT RP Workstation: [id number]    Microbiology: Results for orders placed or performed during the hospital encounter of 09/24/23  Blood culture (routine x 2)     Status: None (Preliminary result)   Collection Time: 09/24/23 12:13 PM   Specimen: BLOOD  Result Value Ref Range Status   Specimen Description BLOOD BLOOD RIGHT ARM  Final   Special Requests   Final    BOTTLES DRAWN AEROBIC AND ANAEROBIC Blood Culture adequate volume   Culture   Final    NO GROWTH 2 DAYS Performed at [facility], [address] [place], [place] [address]    Report Status PENDING  Incomplete  Blood culture (routine x 2)     Status: None (Preliminary result)   Collection Time: 09/24/23  1:57 PM   Specimen: BLOOD  Result Value Ref Range Status   Specimen Description BLOOD BLOOD RIGHT ARM  Final   Special Requests   Final    BOTTLES DRAWN AEROBIC AND ANAEROBIC Blood Culture adequate volume   Culture   Final    NO GROWTH 2 DAYS Performed at [facility], [address] [place], [place] [address]    Report Status PENDING  Incomplete  Resp panel by RT-PCR (RSV, Flu A&B, Covid) Anterior Nasal Swab     Status: None   Collection Time: 09/24/23  1:57 PM   Specimen: Anterior Nasal Swab  Result Value Ref Range Status   SARS Coronavirus 2 by RT PCR NEGATIVE NEGATIVE Final    Comment: (NOTE) SARS-CoV-2 target nucleic acids are NOT DETECTED.  The SARS-CoV-2 RNA is generally detectable in upper respiratory specimens during the acute phase of infection. The lowest concentration of SARS-CoV-2 viral copies this assay can detect is 138 copies/mL. A negative result does not preclude SARS-Cov-2 infection and should not be used as the sole basis for treatment or other patient management decisions. A negative result may occur with  improper specimen collection/handling, submission  of specimen other than nasopharyngeal swab, presence of viral mutation(s) within the areas targeted by this assay, and inadequate number of viral copies(<138 copies/mL). A negative result must be combined with clinical observations, patient history, and epidemiological information. The expected result is Negative.  Fact Sheet for Patients:  BloggerCourse.com  Fact Sheet for Healthcare Providers:  SeriousBroker.it  This test is no t yet approved or cleared by the [place]  FDA and  has been authorized for detection and/or diagnosis of SARS-CoV-2 by FDA under an Emergency Use Authorization (EUA). This EUA will remain  in effect (meaning this test can be used) for the duration of the COVID-19 declaration under Section 564(b)(1) of the Act, 21 U.S.C.section 360bbb-3(b)(1), unless the authorization is terminated  or revoked sooner.       Influenza A by PCR NEGATIVE NEGATIVE Final   Influenza B by PCR NEGATIVE NEGATIVE Final    Comment: (NOTE) The Xpert Xpress SARS-CoV-2/FLU/RSV plus assay is intended as an aid in the diagnosis of influenza from Nasopharyngeal swab specimens and should not be used as a sole basis for treatment. Nasal washings and aspirates are unacceptable for Xpert Xpress SARS-CoV-2/FLU/RSV testing.  Fact Sheet for Patients: BloggerCourse.com  Fact Sheet for Healthcare Providers: SeriousBroker.it  This test is not yet approved or cleared by the [place]  FDA and has been authorized for detection and/or diagnosis of SARS-CoV-2 by FDA under an Emergency Use Authorization (EUA). This EUA will remain in effect (meaning this test can be used) for the duration of the COVID-19 declaration under Section 564(b)(1) of the Act, 21 U.S.C. section 360bbb-3(b)(1), unless the authorization is terminated or revoked.     Resp Syncytial Virus by PCR NEGATIVE NEGATIVE  Final    Comment: (NOTE) Fact Sheet for Patients: BloggerCourse.com  Fact Sheet for Healthcare Providers: SeriousBroker.it  This test is not yet approved or cleared by the [place]  FDA and has been authorized for detection and/or diagnosis of SARS-CoV-2 by FDA under an Emergency Use Authorization (EUA). This EUA will remain in effect (meaning this test can be used) for the duration of the COVID-19 declaration under Section 564(b)(1) of the Act, 21 U.S.C. section 360bbb-3(b)(1), unless the authorization is terminated or revoked.  Performed at [facility], [address] [place], [place] [address]   Respiratory (~20 pathogens) panel by PCR     Status: None  Collection Time: 09/24/23  1:57 PM   Specimen: Nasopharyngeal Swab; Respiratory  Result Value Ref Range Status   Adenovirus NOT DETECTED NOT DETECTED Final   Coronavirus 229E NOT DETECTED NOT DETECTED Final    Comment: (NOTE) The Coronavirus on the Respiratory Panel, DOES NOT test for the novel  Coronavirus (2019 nCoV)    Coronavirus HKU1 NOT DETECTED NOT DETECTED Final   Coronavirus NL63 NOT DETECTED NOT DETECTED Final   Coronavirus OC43 NOT DETECTED NOT DETECTED Final   Metapneumovirus NOT DETECTED NOT DETECTED Final   Rhinovirus / Enterovirus NOT DETECTED NOT DETECTED Final   Influenza A NOT DETECTED NOT DETECTED Final   Influenza B NOT DETECTED NOT DETECTED Final   Parainfluenza Virus 1 NOT DETECTED NOT DETECTED Final   Parainfluenza Virus 2 NOT DETECTED NOT DETECTED Final   Parainfluenza Virus 3 NOT DETECTED NOT DETECTED Final   Parainfluenza Virus 4 NOT DETECTED NOT DETECTED Final   Respiratory Syncytial Virus NOT DETECTED NOT DETECTED Final   Bordetella pertussis NOT DETECTED NOT DETECTED Final   Bordetella Parapertussis NOT DETECTED NOT DETECTED Final   Chlamydophila pneumoniae NOT DETECTED NOT DETECTED Final   Mycoplasma pneumoniae NOT DETECTED  NOT DETECTED Final    Comment: Performed at [facility], [address], [place], [place] [address]    Labs: CBC: Recent Labs  Lab 09/23/23 1510 09/24/23 1213 09/25/23 0543  WBC 8.2 8.1 4.9  NEUTROABS 6.5 6.4  --   HGB 16.5 15.6 12.7*  HCT 46.0 44.6 36.5*  MCV 89.8 92.5 92.6  PLT 180 172 151   Basic Metabolic Panel: Recent Labs  Lab 09/23/23 1510 09/24/23 1213 09/25/23 0543 09/26/23 0503  NA 132* 135 133* 140  K 3.9 4.4 3.8 3.7  CL 99 99 104 106  CO2 24 25 19* 26  GLUCOSE 103* 105* 84 142*  BUN 9 10 9 12   CREATININE 0.92 0.97 0.53* 0.83  CALCIUM 8.9 9.1 7.3* 8.3*   Liver Function Tests: Recent Labs  Lab 09/23/23 1510 09/24/23 1213 09/25/23 0543 09/26/23 0503  AST 31 26 14* 26  ALT 42 33 15 22  ALKPHOS 65 57 27* 45  BILITOT 1.2 1.3* 0.7 0.7  PROT 8.4* 7.2 3.7* 6.1*  ALBUMIN 4.9 4.2 1.9* 2.7*   CBG: No results for input(s): GLUCAP in the last 168 hours.  Discharge time spent: greater than 30 minutes.  Signed: AIDA CHO, MD [facility] 09/26/2023

## 2023-09-26 NOTE — Plan of Care (Signed)

## 2023-09-26 NOTE — TOC CM/SW Note (Addendum)
 Transition of Care [facility]) - Inpatient Brief Assessment   Patient Details  Name: [name] MRN: [id number] Date of Birth: 04-Jun-1965  Transition of Care [facility]) CM/SW Contact:    Corean ONEIDA Haddock, RN Phone Number: 09/26/2023, 2:24 PM   Clinical Narrative:   Transition of Care [facility]) Screening Note   Patient Details  Name: [name] Date of Birth: [birth year]   Transition of Care [facility]) CM/SW Contact:    Corean ONEIDA Haddock, RN Phone Number: 09/26/2023, 2:24 PM    Transition of Care Department [facility]) has reviewed patient and no TOC needs have been identified at this time. If new patient transition needs arise, please place a TOC consult.  Update:  no PCP on file.  List of local PCP added to AVS  Transition of Care Asessment: Insurance and Status: Insurance coverage has been reviewed Patient has primary care physician: Yes     Prior/Current Home Services: No current home services Social Drivers of Health Review: SDOH reviewed no interventions necessary Readmission risk has been reviewed: Yes Transition of care needs: no transition of care needs at this time

## 2023-09-27 ENCOUNTER — Other Ambulatory Visit
Admission: RE | Admit: 2023-09-27 | Discharge: 2023-09-27 | Disposition: A | Discharge status: Home / Self Care | Source: Ambulatory Visit | Attending: Internal Medicine | Admitting: Internal Medicine

## 2023-09-27 DIAGNOSIS — R509 Fever, unspecified: Secondary | ICD-10-CM | POA: Insufficient documentation

## 2023-09-27 LAB — PARVOVIRUS B19 ANTIBODY, IGG AND IGM
Parovirus B19 IgG Abs: 4.3 {index} — ABNORMAL HIGH (ref 0.0–0.8)
Parovirus B19 IgM Abs: 0.1 {index} (ref 0.0–0.8)

## 2023-09-27 LAB — LYME DISEASE SEROLOGY W/REFLEX: Lyme Total Antibody EIA: NEGATIVE

## 2023-09-29 LAB — SPOTTED FEVER GROUP ANTIBODIES
Spotted Fever Group IgG: <1:64 {titer}
Spotted Fever Group IgM: <1:64 {titer}

## 2023-09-29 LAB — CULTURE, BLOOD (ROUTINE X 2)
Culture: NO GROWTH
Culture: NO GROWTH
Special Requests: ADEQUATE
Special Requests: ADEQUATE

## 2023-10-07 NOTE — Discharge Summary (Addendum)
 I called patient on 10/07/2023 at 4:57 PM to inform him about test results.  He has access to My Chart and had already seen the test results.  He was informed that spotted fever group antibody test was negative.  He was encouraged to follow-up with his primary care physician for routine health maintenance and to return to the ED if he was not feeling well.    He was also informed that test can be a false negative test and it is difficult to completely rule out RMSF infection.

## 2024-09-20 ENCOUNTER — Ambulatory Visit: Admitting: Urology
